# Patient Record
Sex: Female | Born: 1990 | Race: White | Hispanic: No | Marital: Married | Smoking: Never smoker
Health system: Southern US, Community
[De-identification: ages and names within clinical notes are randomized; demographics above are authoritative.]

## PROBLEM LIST (undated history)

## (undated) DIAGNOSIS — K219 Gastro-esophageal reflux disease without esophagitis: Secondary | ICD-10-CM

## (undated) DIAGNOSIS — R87619 Unspecified abnormal cytological findings in specimens from cervix uteri: Secondary | ICD-10-CM

## (undated) DIAGNOSIS — A4902 Methicillin resistant Staphylococcus aureus infection, unspecified site: Secondary | ICD-10-CM

## (undated) DIAGNOSIS — E559 Vitamin D deficiency, unspecified: Secondary | ICD-10-CM

## (undated) DIAGNOSIS — S060X9A Concussion with loss of consciousness of unspecified duration, initial encounter: Secondary | ICD-10-CM

## (undated) DIAGNOSIS — R51 Headache: Secondary | ICD-10-CM

## (undated) DIAGNOSIS — R61 Generalized hyperhidrosis: Secondary | ICD-10-CM

## (undated) DIAGNOSIS — S99911A Unspecified injury of right ankle, initial encounter: Secondary | ICD-10-CM

## (undated) DIAGNOSIS — K589 Irritable bowel syndrome without diarrhea: Secondary | ICD-10-CM

## (undated) DIAGNOSIS — J45909 Unspecified asthma, uncomplicated: Secondary | ICD-10-CM

## (undated) HISTORY — PX: WISDOM TOOTH EXTRACTION: SHX21

## (undated) HISTORY — DX: Unspecified injury of right ankle, initial encounter: S99.911A

## (undated) HISTORY — DX: Vitamin D deficiency, unspecified: E55.9

## (undated) HISTORY — DX: Methicillin resistant Staphylococcus aureus infection, unspecified site: A49.02

## (undated) HISTORY — DX: Concussion with loss of consciousness of unspecified duration, initial encounter: S06.0X9A

## (undated) HISTORY — DX: Unspecified abnormal cytological findings in specimens from cervix uteri: R87.619

## (undated) HISTORY — PX: OTHER SURGICAL HISTORY: SHX169

## (undated) HISTORY — DX: Generalized hyperhidrosis: R61

## (undated) HISTORY — DX: Irritable bowel syndrome, unspecified: K58.9

---
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
## 2009-01-14 HISTORY — PX: COLPOSCOPY: SHX161

## 2009-03-14 DIAGNOSIS — R87619 Unspecified abnormal cytological findings in specimens from cervix uteri: Secondary | ICD-10-CM

## 2009-03-14 HISTORY — DX: Unspecified abnormal cytological findings in specimens from cervix uteri: R87.619

## 2012-12-24 ENCOUNTER — Encounter (HOSPITAL_COMMUNITY): Payer: Self-pay

## 2012-12-25 ENCOUNTER — Encounter (HOSPITAL_COMMUNITY): Payer: Self-pay

## 2012-12-25 ENCOUNTER — Other Ambulatory Visit (HOSPITAL_COMMUNITY): Payer: Self-pay | Admitting: Otolaryngology

## 2012-12-25 ENCOUNTER — Encounter (HOSPITAL_COMMUNITY)
Admission: RE | Admit: 2012-12-25 | Discharge: 2012-12-25 | Disposition: A | Discharge status: Home / Self Care | Payer: BC Managed Care – PPO | Source: Ambulatory Visit | Attending: Otolaryngology | Admitting: Otolaryngology

## 2012-12-25 DIAGNOSIS — Z01812 Encounter for preprocedural laboratory examination: Secondary | ICD-10-CM | POA: Insufficient documentation

## 2012-12-25 DIAGNOSIS — Z01818 Encounter for other preprocedural examination: Secondary | ICD-10-CM | POA: Insufficient documentation

## 2012-12-25 LAB — CBC
HCT: 37.9 % (ref 36.0–46.0)
Hemoglobin: 12.8 g/dL (ref 12.0–15.0)
MCH: 26.2 pg (ref 26.0–34.0)
RBC: 4.89 MIL/uL (ref 3.87–5.11)

## 2012-12-25 LAB — HCG, SERUM, QUALITATIVE: Preg, Serum: NEGATIVE

## 2012-12-25 NOTE — H&P (Signed)
Jenna Lane, Jenna Lane 22 y.o., female [id number]     Chief Complaint: Recurrent tonsillolith formation  HPI: 22 year old white female has had off and on production of smelly tonsils those for several years.  They seem to be recurring more frequently here recently.  She has a foreign body sensation.  She reports halitosis.  No sore throat, strep throat, or tonsillitis.  She does not snore or have sleep apnea.  She is studying to become a Psychiatrist, and needs to try to work any possible surgery in around her holiday schedule.  One year return visit.  She continues to have recurrent tonsillolith debris pain, foreign body sensation, and halitosis.  She would like to proceed with tonsillectomy.  I think this is wholly reasonable.   I discussed the surgery in detail including risks and complications.  Questions were answered and informed consent was obtained.  Postoperative prescriptions for liquid hydrocodone and liquid oxycodone narcotic analgesics written and given.  I discussed advancement of diet and activity including return to school and return to work.  I will see her back one-week after surgery.  PMH:No past medical history on file.  Surg Hx: Past Surgical History  Procedure Laterality Date  . Wisdom tooth extraction      FHx:  No family history on file. SocHx:  reports that she has never smoked. She does not have any smokeless tobacco history on file. She reports that she drinks alcohol. She reports that she does not use illicit drugs.  ALLERGIES: No Known Allergies   (Not in a hospital admission)  Results for orders placed during the hospital encounter of 12/25/12 (from the past 48 hour(s))  CBC     Status: Abnormal   Collection Time    12/25/12 12:28 PM      Result Value Range   WBC 6.9  4.0 - 10.5 K/uL   RBC 4.89  3.87 - 5.11 MIL/uL   Hemoglobin 12.8  12.0 - 15.0 g/dL   HCT 53.6  64.4 - 03.4 %   MCV 77.5 (*) 78.0 - 100.0 fL   MCH 26.2  26.0 - 34.0 pg   MCHC 33.8  30.0  - 36.0 g/dL   RDW 74.2  59.5 - 63.8 %   Platelets 203  150 - 400 K/uL  HCG, SERUM, QUALITATIVE     Status: None   Collection Time    12/25/12 12:28 PM      Result Value Range   Preg, Serum NEGATIVE  NEGATIVE   Comment:            THE SENSITIVITY OF THIS     METHODOLOGY IS >10 mIU/mL.   No results found.  Last menstrual period 12/14/2012.  BP:108/76,  HR: 70 b/min,  Height: 5 ft 3 in, Weight: 154 lb , BMI: 27.3 kg/m2,   PHYSICAL EXAM: She is trim and healthy-appearing.  I do not detect bad breath.  Mental status is sharp.  She hears well in conversational speech.  Voice is clear and respirations unlabored through the nose.  The head is atraumatic and neck supple.  Cranial nerves intact.  Ear canals are clear with normal drums.  Anterior nose is moist and patent.  Oral cavity is moist with teeth in good repair.  Oropharynx shows 3+ cryptic tonsils with a normal soft palate.  No velopharyngeal insufficiency.  Neck without adenopathy.   Lungs: Clear to auscultation Heart: Regular rate and rhythm without murmurs Abdomen: Soft, active Extremities: Normal configuration Neurologic: Symmetric, grossly intact.  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  We are all set to take out your tonsils, possible adenoids, next week.  I emphasize hydration and pain control.  No strenuous activity for 2 weeks.  Recheck here 9 days.   See our tonsillectomy instructions  OxyCODONE HCl - 5 MG/5ML Oral Solution;5-10 ml po q4h prn severe pain; Qty400; R0; Rx. Hydrocodone-Acetaminophen 7.5-325 MG/15ML Oral Solution;10-20 ml po q4-6h prn pain; Qty500; R0; Rx.  Jenna Lane, Jenna Lane 12/25/2012, 7:02 PM

## 2013-01-01 ENCOUNTER — Encounter (HOSPITAL_COMMUNITY): Payer: Self-pay | Admitting: *Deleted

## 2013-01-01 MED ORDER — DEXAMETHASONE SODIUM PHOSPHATE 10 MG/ML IJ SOLN
8.0000 mg | Freq: Once | INTRAMUSCULAR | Status: AC
  Administered 2013-01-02: 8 mg via INTRAVENOUS
  Filled 2013-01-01: qty 1

## 2013-01-01 MED ORDER — SODIUM CHLORIDE 0.9 % IV SOLN
2.0000 g | Freq: Once | INTRAVENOUS | Status: AC
  Administered 2013-01-02: 2 g via INTRAVENOUS
  Filled 2013-01-01: qty 2000

## 2013-01-02 ENCOUNTER — Ambulatory Visit (HOSPITAL_COMMUNITY): Payer: BC Managed Care – PPO | Admitting: Anesthesiology

## 2013-01-02 ENCOUNTER — Ambulatory Visit (HOSPITAL_COMMUNITY)
Admission: RE | Admit: 2013-01-02 | Discharge: 2013-01-02 | Disposition: A | Discharge status: Home / Self Care | Payer: BC Managed Care – PPO | Source: Ambulatory Visit | Attending: Otolaryngology | Admitting: Otolaryngology

## 2013-01-02 ENCOUNTER — Encounter (HOSPITAL_COMMUNITY)
Admission: RE | Disposition: A | Discharge status: Home / Self Care | Payer: Self-pay | Source: Ambulatory Visit | Attending: Otolaryngology

## 2013-01-02 ENCOUNTER — Encounter (HOSPITAL_COMMUNITY): Payer: Self-pay | Admitting: *Deleted

## 2013-01-02 ENCOUNTER — Encounter (HOSPITAL_COMMUNITY): Payer: BC Managed Care – PPO | Admitting: Anesthesiology

## 2013-01-02 DIAGNOSIS — J358 Other chronic diseases of tonsils and adenoids: Secondary | ICD-10-CM | POA: Insufficient documentation

## 2013-01-02 HISTORY — DX: Headache: R51

## 2013-01-02 HISTORY — PX: TONSILLECTOMY AND ADENOIDECTOMY: SHX28

## 2013-01-02 SURGERY — TONSILLECTOMY AND ADENOIDECTOMY
Anesthesia: General | Site: Mouth

## 2013-01-02 MED ORDER — SUCCINYLCHOLINE CHLORIDE 20 MG/ML IJ SOLN
INTRAMUSCULAR | Status: DC | PRN
  Administered 2013-01-02: 100 mg via INTRAVENOUS

## 2013-01-02 MED ORDER — LACTATED RINGERS IV SOLN
INTRAVENOUS | Status: DC | PRN
  Administered 2013-01-02 (×2): via INTRAVENOUS

## 2013-01-02 MED ORDER — 0.9 % SODIUM CHLORIDE (POUR BTL) OPTIME
TOPICAL | Status: DC | PRN
  Administered 2013-01-02: 1000 mL

## 2013-01-02 MED ORDER — ONDANSETRON HCL 4 MG PO TABS: 4.0000 mg | ORAL_TABLET | ORAL | Status: DC | PRN

## 2013-01-02 MED ORDER — DIPHENHYDRAMINE HCL 50 MG/ML IJ SOLN
INTRAMUSCULAR | Status: DC | PRN
  Administered 2013-01-02: 12.5 mg via INTRAVENOUS

## 2013-01-02 MED ORDER — PROMETHAZINE HCL 25 MG/ML IJ SOLN: 6.2500 mg | INTRAMUSCULAR | Status: DC | PRN

## 2013-01-02 MED ORDER — HYDROCODONE-ACETAMINOPHEN 7.5-325 MG/15ML PO SOLN: 10.0000 mL | ORAL | Status: DC | PRN

## 2013-01-02 MED ORDER — OXYCODONE-ACETAMINOPHEN 5-325 MG/5ML PO SOLN: 5.0000 mL | ORAL | Status: DC | PRN

## 2013-01-02 MED ORDER — PROPOFOL 10 MG/ML IV BOLUS
INTRAVENOUS | Status: DC | PRN
  Administered 2013-01-02: 200 mg via INTRAVENOUS

## 2013-01-02 MED ORDER — ONDANSETRON HCL 4 MG/2ML IJ SOLN
INTRAMUSCULAR | Status: DC | PRN
  Administered 2013-01-02: 4 mg via INTRAVENOUS

## 2013-01-02 MED ORDER — MIDAZOLAM HCL 5 MG/5ML IJ SOLN
INTRAMUSCULAR | Status: DC | PRN
  Administered 2013-01-02: 2 mg via INTRAVENOUS

## 2013-01-02 MED ORDER — LIDOCAINE-EPINEPHRINE 0.5 %-1:200000 IJ SOLN
INTRAMUSCULAR | Status: DC | PRN
  Administered 2013-01-02: 10 mL

## 2013-01-02 MED ORDER — OXYCODONE HCL 5 MG PO TABS: 5.0000 mg | ORAL_TABLET | Freq: Once | ORAL | Status: AC | PRN

## 2013-01-02 MED ORDER — OXYCODONE HCL 5 MG/5ML PO SOLN
ORAL | Status: AC
  Filled 2013-01-02: qty 5

## 2013-01-02 MED ORDER — MORPHINE SULFATE 2 MG/ML IJ SOLN: 1.0000 mg | INTRAMUSCULAR | Status: DC | PRN

## 2013-01-02 MED ORDER — DEXTROSE-NACL 5-0.45 % IV SOLN: INTRAVENOUS | Status: DC

## 2013-01-02 MED ORDER — LACTATED RINGERS IV SOLN
INTRAVENOUS | Status: DC
  Administered 2013-01-02: 07:00:00 via INTRAVENOUS

## 2013-01-02 MED ORDER — OXYCODONE HCL 5 MG/5ML PO SOLN
5.0000 mg | Freq: Once | ORAL | Status: AC | PRN
  Administered 2013-01-02: 5 mg via ORAL

## 2013-01-02 MED ORDER — HYDROMORPHONE HCL PF 1 MG/ML IJ SOLN: 0.2500 mg | INTRAMUSCULAR | Status: DC | PRN

## 2013-01-02 MED ORDER — FENTANYL CITRATE 0.05 MG/ML IJ SOLN
INTRAMUSCULAR | Status: DC | PRN
  Administered 2013-01-02: 25 ug via INTRAVENOUS
  Administered 2013-01-02 (×2): 50 ug via INTRAVENOUS
  Administered 2013-01-02: 25 ug via INTRAVENOUS
  Administered 2013-01-02: 100 ug via INTRAVENOUS

## 2013-01-02 MED ORDER — ONDANSETRON HCL 4 MG/2ML IJ SOLN: 4.0000 mg | INTRAMUSCULAR | Status: DC | PRN

## 2013-01-02 MED ORDER — LIDOCAINE-EPINEPHRINE 0.5 %-1:200000 IJ SOLN
INTRAMUSCULAR | Status: AC
  Filled 2013-01-02: qty 1

## 2013-01-02 MED ORDER — LIDOCAINE HCL (CARDIAC) 20 MG/ML IV SOLN
INTRAVENOUS | Status: DC | PRN
  Administered 2013-01-02: 50 mg via INTRAVENOUS

## 2013-01-02 SURGICAL SUPPLY — 33 items
CANISTER SUCTION 2500CC (MISCELLANEOUS) ×2 IMPLANT
CATH ROBINSON RED A/P 10FR (CATHETERS) ×2 IMPLANT
CLEANER TIP ELECTROSURG 2X2 (MISCELLANEOUS) ×2 IMPLANT
CLOTH BEACON ORANGE TIMEOUT ST (SAFETY) IMPLANT
COAGULATOR SUCT SWTCH 10FR 6 (ELECTROSURGICAL) ×2 IMPLANT
CRADLE DONUT ADULT HEAD (MISCELLANEOUS) ×2 IMPLANT
DECANTER SPIKE VIAL GLASS SM (MISCELLANEOUS) ×2 IMPLANT
ELECT COATED BLADE 2.86 ST (ELECTRODE) IMPLANT
ELECT REM PT RETURN 9FT ADLT (ELECTROSURGICAL) ×2
ELECT REM PT RETURN 9FT PED (ELECTROSURGICAL)
ELECTRODE REM PT RETRN 9FT PED (ELECTROSURGICAL) IMPLANT
ELECTRODE REM PT RTRN 9FT ADLT (ELECTROSURGICAL) ×1 IMPLANT
GAUZE SPONGE 4X4 16PLY XRAY LF (GAUZE/BANDAGES/DRESSINGS) ×2 IMPLANT
GLOVE ECLIPSE 8.0 STRL XLNG CF (GLOVE) ×2 IMPLANT
GOWN PREVENTION PLUS XLARGE (GOWN DISPOSABLE) ×2 IMPLANT
GOWN STRL NON-REIN LRG LVL3 (GOWN DISPOSABLE) ×2 IMPLANT
KIT BASIN OR (CUSTOM PROCEDURE TRAY) ×2 IMPLANT
KIT ROOM TURNOVER OR (KITS) ×2 IMPLANT
NEEDLE SPNL 22GX3.5 QUINCKE BK (NEEDLE) ×2 IMPLANT
NS IRRIG 1000ML POUR BTL (IV SOLUTION) ×2 IMPLANT
PACK SURGICAL SETUP 50X90 (CUSTOM PROCEDURE TRAY) ×2 IMPLANT
PAD ARMBOARD 7.5X6 YLW CONV (MISCELLANEOUS) ×4 IMPLANT
PENCIL FOOT CONTROL (ELECTRODE) ×2 IMPLANT
SPECIMEN JAR SMALL (MISCELLANEOUS) IMPLANT
SPONGE TONSIL 1 RF SGL (DISPOSABLE) ×2 IMPLANT
SYR BULB 3OZ (MISCELLANEOUS) IMPLANT
SYR CONTROL 10ML LL (SYRINGE) ×2 IMPLANT
TOWEL OR 17X24 6PK STRL BLUE (TOWEL DISPOSABLE) ×4 IMPLANT
TUBE CONNECTING 12X1/4 (SUCTIONS) ×2 IMPLANT
TUBE SALEM SUMP 14F W/ARV (TUBING) IMPLANT
TUBE SALEM SUMP 16 FR W/ARV (TUBING) IMPLANT
WATER STERILE IRR 1000ML POUR (IV SOLUTION) ×2 IMPLANT
YANKAUER SUCT BULB TIP NO VENT (SUCTIONS) ×2 IMPLANT

## 2013-01-02 NOTE — Op Note (Signed)
01/02/2013  8:10 AM    Bobbye Riggs  [id number]   Pre-Op Dx:  Chronic tonsillolith formation  Post-op Dx: Same  Proc: Tonsillectomy   Surg:  Flo Shanks T MD  Anes:  GOT  EBL:  Minimal  Comp:  None  Findings:  3+ pedunculated tonsils. Normal soft palate. No residual adenoids. Clear anterior nose.  Procedure:  With the patient in a comfortable supine position,  general orotracheal anesthesia was induced without difficulty.  At an appropriate level, the patient was turned 90 away from anesthesia and placed in Trendelenburg.  A clean preparation and draping was accomplished.  Taking care to protect lips, teeth, and endotracheal tube, the Crowe-Davis mouth gag was introduced, expanded for visualization, and suspended from the Mayo stand in the standard fashion.  The findings were as described above.  Palate  retractor  and mirror were used to examine the nasopharynx with the findings as described above.   Anterior nose was examined with a nasal speculum with the findings as described above.  No adenoidectomy was indicated.  1/4% Xylocaine with 1:200,000 epinephrine, 10 cc's, was infiltrated into the peritonsillar planes on both sides for intraoperative hemostasis.  Several minutes were allowed for this to take effect.   Beginning on the  right side, the tonsil was grasped and retracted medially.  The mucosa over the anterior and superior poles was coagulated and then cut down to the capsule of the tonsil.  Using the cautery tip as a blunt dissector, the tonsil was dissected from its muscular fossa from anterior to posterior and from superior to inferior.  Fibrous bands were lysed as necessary.  Crossing vessels were coagulated as identified.  The tonsil was removed in its entirety as determined by examination of both tonsil and fossa.  A small additional quantity of cautery rendered the fossa hemostatic.    After completing the 1st tonsillectomy, the 2nd one was performed in  identical fashion.   At this point the  mouthgag was relaxed for several minutes.  Upon reexpansion,  Hemostasis was observed. The mouth gag  was relaxed and removed.  The dental status was intact.   At this point the procedure was completed.  The patient was returned to anesthesia, awakened, extubated, and transferred to recovery in stable condition.   Dispo:  OR to PACU.   Will observe for six hours, overnight if necessary and then discharge to home in care of family.  Plan:  Analgesia, hydration, limited activity for two weeks.  Advance diet as comfortable.  Return to school or work at 10 days.  Cephus Richer  MD.

## 2013-01-02 NOTE — H&P (View-Only) (Signed)
[name],  Jenna Lane 22 y.o., female [id number]     Chief Complaint: Recurrent tonsillolith formation  HPI: 21-year-old white female has had off and on production of smelly tonsils those for several years.  They seem to be recurring more frequently here recently.  She has a foreign body sensation.  She reports halitosis.  No sore throat, strep throat, or tonsillitis.  She does not snore or have sleep apnea.  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  One year return visit.  She continues to have recurrent tonsillolith debris pain, foreign body sensation, and halitosis.  She would like to proceed with tonsillectomy.  I think this is wholly reasonable.   I discussed the surgery in detail including risks and complications.  Questions were answered and informed consent was obtained.  Postoperative prescriptions for liquid hydrocodone and liquid oxycodone narcotic analgesics written and given.  I discussed advancement of diet and activity including return to school and return to work.  I will see her back one-week after surgery.  PMH:No past medical history on file.  Surg Hx: Past Surgical History  Procedure Laterality Date  . Wisdom tooth extraction      FHx:  No family history on file. SocHx:  reports that she has never smoked. She does not have any smokeless tobacco history on file. She reports that she drinks alcohol. She reports that she does not use illicit drugs.  ALLERGIES: No Known Allergies   (Not in a hospital admission)  Results for orders placed during the hospital encounter of 12/25/12 (from the past 48 hour(s))  CBC     Status: Abnormal   Collection Time    12/25/12 12:28 PM      Result Value Range   WBC 6.9  4.0 - 10.5 K/uL   RBC 4.89  3.87 - 5.11 MIL/uL   Hemoglobin 12.8  12.0 - 15.0 g/dL   HCT 37.9  36.0 - 46.0 %   MCV 77.5 (*) 78.0 - 100.0 fL   MCH 26.2  26.0 - 34.0 pg   MCHC 33.8  30.0  - 36.0 g/dL   RDW 14.1  11.5 - 15.5 %   Platelets 203  150 - 400 K/uL  HCG, SERUM, QUALITATIVE     Status: None   Collection Time    12/25/12 12:28 PM      Result Value Range   Preg, Serum NEGATIVE  NEGATIVE   Comment:            THE SENSITIVITY OF THIS     METHODOLOGY IS >10 mIU/mL.   No results found.  Last menstrual period 12/14/2012.  BP:108/76,  HR: 70 b/min,  Height: 5 ft 3 in, Weight: 154 lb , BMI: 27.3 kg/m2,   PHYSICAL EXAM: She is trim and healthy-appearing.  I do not detect bad breath.  Mental status is sharp.  She hears well in conversational speech.  Voice is clear and respirations unlabored through the nose.  The head is atraumatic and neck supple.  Cranial nerves intact.  Ear canals are clear with normal drums.  Anterior nose is moist and patent.  Oral cavity is moist with teeth in good repair.  Oropharynx shows 3+ cryptic tonsils with a normal soft palate.  No velopharyngeal insufficiency.  Neck without adenopathy.   Lungs: Clear to auscultation Heart: Regular rate and rhythm without murmurs Abdomen: Soft, active Extremities: Normal configuration Neurologic: Symmetric, grossly intact.     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  We are all set to take out your tonsils, possible adenoids, next week.  I emphasize hydration and pain control.  No strenuous activity for 2 weeks.  Recheck here 9 days.   See our tonsillectomy instructions  OxyCODONE HCl - 5 MG/5ML Oral Solution;5-10 ml po q4h prn severe pain; Qty400; R0; Rx. Hydrocodone-Acetaminophen 7.5-325 MG/15ML Oral Solution;10-20 ml po q4-6h prn pain; Qty500; R0; Rx.  Keina Mutch 12/25/2012, 7:02 PM     

## 2013-01-02 NOTE — Anesthesia Preprocedure Evaluation (Addendum)
Anesthesia Evaluation  Patient identified by MRN, date of birth, ID band Patient awake    Reviewed: Allergy & Precautions, H&P , NPO status , Patient's Chart, lab work & pertinent test results, reviewed documented beta blocker date and time   Airway Mallampati: II TM Distance: >3 FB Neck ROM: full    Dental  (+) Teeth Intact and Dental Advisory Given   Pulmonary neg pulmonary ROS,  breath sounds clear to auscultation        Cardiovascular negative cardio ROS  Rhythm:regular Rate:Normal     Neuro/Psych negative neurological ROS  negative psych ROS   GI/Hepatic negative GI ROS, Neg liver ROS,   Endo/Other  negative endocrine ROS  Renal/GU negative Renal ROS     Musculoskeletal   Abdominal   Peds  Hematology   Anesthesia Other Findings   Reproductive/Obstetrics negative OB ROS                          Anesthesia Physical Anesthesia Plan  ASA: I  Anesthesia Plan: General ETT   Post-op Pain Management:    Induction:   Airway Management Planned:   Additional Equipment:   Intra-op Plan:   Post-operative Plan:   Informed Consent: I have reviewed the patients History and Physical, chart, labs and discussed the procedure including the risks, benefits and alternatives for the proposed anesthesia with the patient or authorized representative who has indicated his/her understanding and acceptance.   Dental Advisory Given and Dental advisory given  Plan Discussed with: CRNA, Surgeon and Anesthesiologist  Anesthesia Plan Comments:        Anesthesia Quick Evaluation

## 2013-01-02 NOTE — Transfer of Care (Signed)
Immediate Anesthesia Transfer of Care Note  Patient: Jenna Lane  Procedure(s) Performed: Procedure(s): TONSILLECTOMY  (N/A)  Patient Location: PACU  Anesthesia Type:General  Level of Consciousness: awake  Airway & Oxygen Therapy: Patient Spontanous Breathing  Post-op Assessment: Report given to PACU RN and Post -op Vital signs reviewed and stable  Post vital signs: Reviewed and stable  Complications: No apparent anesthesia complications

## 2013-01-02 NOTE — Interval H&P Note (Signed)
History and Physical Interval Note:  01/02/2013 8:01 AM  Jenna Lane  has presented today for surgery, with the diagnosis of recurrent tonsilitis  tonsillolith formation  The various methods of treatment have been discussed with the patient and family. After consideration of risks, benefits and other options for treatment, the patient has consented to  Procedure(s): TONSILLECTOMY AND POSSIBLE ADENOIDECTOMY (N/A) as a surgical intervention .  The patient's history has been re-reviewed, patient re-examined, no change in status, stable for surgery.  I have re-reviewed the patient's chart and labs.  Questions were answered to the patient's satisfaction.     Flo Shanks

## 2013-01-02 NOTE — Anesthesia Procedure Notes (Addendum)
Procedure Name: Intubation Date/Time: 01/02/2013 7:38 AM Performed by: Brien Mates D Pre-anesthesia Checklist: Patient identified, Emergency Drugs available, Suction available, Timeout performed and Patient being monitored Patient Re-evaluated:Patient Re-evaluated prior to inductionOxygen Delivery Method: Circle system utilized Preoxygenation: Pre-oxygenation with 100% oxygen Intubation Type: IV induction Ventilation: Mask ventilation without difficulty Laryngoscope Size: Miller and 2 Grade View: Grade I Tube type: Oral Tube size: 7.5 mm Number of attempts: 1 Airway Equipment and Method: Stylet Placement Confirmation: ETT inserted through vocal cords under direct vision,  positive ETCO2 and breath sounds checked- equal and bilateral Secured at: 21 cm Tube secured with: Tape (tagged) Dental Injury: Teeth and Oropharynx as per pre-operative assessment

## 2013-01-02 NOTE — Discharge Summary (Signed)
  01/02/2013 3:28 PM  Jenna Lane [id number]  Discharge summary    Temp:  [98.2 F (36.8 C)-98.4 F (36.9 C)] 98.4 F (36.9 C) (12/20 0915) Pulse Rate:  [68-108] 83 (12/20 0945) Resp:  [14-19] 19 (12/20 0945) BP: (115-131)/(60-80) 119/75 mmHg (12/20 0915) SpO2:  [93 %-98 %] 98 % (12/20 0945),     Intake/Output Summary (Last 24 hours) at 01/02/13 1528 Last data filed at 01/02/13 0826  Gross per 24 hour  Intake   1300 ml  Output     25 ml  Net   1275 ml    No results found for this or any previous visit (from the past 24 hour(s)).  Admit:  20 DEC Discharge: 20 DEC Final Diagnosis:  Chronic cryptic tonsillitis Proc:  Tonsillectomy 20 DEC Comp: none Cond: ambulatory, voiding. Pain controlled.  No bleeding.  Taking po intake Rx's:  Oxycodone, Hydrocodone liquid narcotic analgesics Recheck: 10 days Instructions written and given  Hosp course:  Underwent tonsillectomy.  Was transferred to PACU.    Was discharged home 4 hrs earlier than ordered due to [facility] medical records inconsistencies.     Flo Shanks

## 2013-01-02 NOTE — Anesthesia Postprocedure Evaluation (Signed)
Anesthesia Post Note  Patient: Jenna Lane  Procedure(s) Performed: Procedure(s) (LRB): TONSILLECTOMY  (N/A)  Anesthesia type: general  Patient location: PACU  Post pain: Pain level controlled  Post assessment: Patient's Cardiovascular Status Stable  Last Vitals:  Filed Vitals:   01/02/13 0830  BP: 121/72  Pulse: 96  Temp:   Resp: 16    Post vital signs: Reviewed and stable  Level of consciousness: sedated  Complications: No apparent anesthesia complications

## 2013-01-05 ENCOUNTER — Encounter (HOSPITAL_COMMUNITY): Payer: Self-pay | Admitting: Otolaryngology

## 2013-03-01 ENCOUNTER — Encounter: Payer: Self-pay | Admitting: Obstetrics & Gynecology

## 2013-03-02 ENCOUNTER — Ambulatory Visit: Payer: Self-pay | Admitting: Obstetrics & Gynecology

## 2013-04-05 ENCOUNTER — Ambulatory Visit (INDEPENDENT_AMBULATORY_CARE_PROVIDER_SITE_OTHER): Payer: BC Managed Care – PPO | Admitting: Obstetrics & Gynecology

## 2013-04-05 ENCOUNTER — Encounter: Payer: Self-pay | Admitting: Obstetrics & Gynecology

## 2013-04-05 VITALS — BP 98/60 | HR 78 | Resp 18 | Ht 63.0 in | Wt 160.0 lb

## 2013-04-05 DIAGNOSIS — Z0000 Encounter for general adult medical examination without abnormal findings: Secondary | ICD-10-CM

## 2013-04-05 DIAGNOSIS — Z202 Contact with and (suspected) exposure to infections with a predominantly sexual mode of transmission: Secondary | ICD-10-CM

## 2013-04-05 DIAGNOSIS — Z124 Encounter for screening for malignant neoplasm of cervix: Secondary | ICD-10-CM

## 2013-04-05 DIAGNOSIS — Z01419 Encounter for gynecological examination (general) (routine) without abnormal findings: Secondary | ICD-10-CM

## 2013-04-05 LAB — POCT URINALYSIS DIPSTICK
BILIRUBIN UA: NEGATIVE
Glucose, UA: NEGATIVE
KETONES UA: NEGATIVE
NITRITE UA: NEGATIVE
PH UA: 5
Protein, UA: NEGATIVE
RBC UA: NEGATIVE
Urobilinogen, UA: NEGATIVE

## 2013-04-05 MED ORDER — DROSPIRENONE-ETHINYL ESTRADIOL 3-0.02 MG PO TABS
1.0000 | ORAL_TABLET | Freq: Every day | ORAL | Status: DC
Start: 2013-04-05 — End: 2014-03-18

## 2013-04-05 NOTE — Progress Notes (Signed)
23 y.o. G0P0000 SingleCaucasianF here for annual exam.  Cycles are regular.  Using condoms.  Considering restarting OCPs.  Not sure but wants an Rx.  Aware of risks--DVT/PE, elevated BP, nausea, headache  H/O AGUS pap with negative evaluation and negative follow-up paps.  Desires a Pap today.  D/w pt guidelines.    Desires STD testing today.  With current for   Has lost almost 20 pounds over last two years.  Goal is 140-145.  Patient's last menstrual period was 03/12/2013.          Sexually active: yes  The current method of family planning is condoms per patient 100%of the time.    Exercising: no  not regularly Smoker:  no  Health Maintenance: Pap:  02/24/12 WNL History of abnormal Pap:  Yes 3/11 AGUS pap-with colpo/ECC-CIN I MMG:  none Colonoscopy:  none BMD:   none TDaP:  2009 Screening Labs: not today, Hb today: 13.0, Urine today: WBC-+1, PH-5.0   reports that she has never smoked. She has never used smokeless tobacco. She reports that she drinks alcohol. She reports that she does not use illicit drugs.  Past Medical History  Diagnosis Date  . Headache(784.0)     quite a few years ago  . MRSA infection 2009    on skin  . Abnormal Pap smear of cervix 3/11    h/o AGUS pap  . 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 Right ankle injury     Past Surgical History  Procedure Laterality Date  . Wisdom tooth extraction    . Tonsillectomy and adenoidectomy N/A 01/02/2013    Procedure: TONSILLECTOMY ;  Surgeon: Jodi Marble, MD;  Location: [facility];  Service: ENT;  Laterality: N/A;  . Cystectomy      left axillary    No current outpatient prescriptions on file.   No current facility-administered medications for this visit.    Family History  Problem Relation Age of Onset  . Hyperlipidemia Father   . Hypertension Father     ?  Marland Kitchen Heart disease Paternal Grandfather   . Depression Maternal Uncle     ROS:  Pertinent items are noted in HPI.  Otherwise, a comprehensive ROS was  negative.  Exam:   BP 98/60  Pulse 78  Resp 18  Ht 5\' 3"  (1.6 m)  Wt 160 lb (72.576 kg)  BMI 28.35 kg/m2  LMP 03/12/2013  Weight change: -9lbs  Height: 5\' 3"  (160 cm)  Ht Readings from Last 3 Encounters:  04/05/13 5\' 3"  (1.6 m)  12/25/12 5\' 3"  (1.6 m)    General appearance: alert, cooperative and appears stated age Head: Normocephalic, without obvious abnormality, atraumatic Neck: no adenopathy, supple, symmetrical, trachea midline and thyroid normal to inspection and palpation Lungs: clear to auscultation bilaterally Breasts: normal appearance, no masses or tenderness Heart: regular rate and rhythm Abdomen: soft, non-tender; bowel sounds normal; no masses,  no organomegaly Extremities: extremities normal, atraumatic, no cyanosis or edema Skin: Skin color, texture, turgor normal. No rashes or lesions Lymph nodes: Cervical, supraclavicular, and axillary nodes normal. No abnormal inguinal nodes palpated Neurologic: Grossly normal   Pelvic: External genitalia:  no lesions              Urethra:  normal appearing urethra with no masses, tenderness or lesions              Bartholins and Skenes: normal                 Vagina: normal appearing vagina  with normal color and discharge, no lesions              Cervix: no lesions              Pap taken: yes Bimanual Exam:  Uterus:  normal size, contour, position, consistency, mobility, non-tender              Adnexa: normal adnexa and no mass, fullness, tenderness               Rectovaginal: Confirms               Anus:  normal sphincter tone, no lesions  A:  Well Woman with normal exam Family hx of heart disease in father, pat. Uncle, and PGF Desires STD testing.   H/O AGUS with colpo and biopsy 3/11  P:   mammogram pap smear only today Rx for gianvi given.  Pt will let me know if she starts it. Gc/Chl, HIV, and RPR today.  H/o Hep B vaccination as a child.   Lipids and CMP return annually or prn  An After Visit Summary was  printed and given to the patient.

## 2013-04-05 NOTE — Patient Instructions (Signed)

## 2013-04-06 LAB — LIPID PANEL
Cholesterol: 142 mg/dL (ref 0–200)
HDL: 68 mg/dL (ref >39)
LDL Cholesterol: 61 mg/dL (ref 0–99)
TRIGLYCERIDES: 65 mg/dL (ref <150)
Total CHOL/HDL Ratio: 2.1 Ratio
VLDL: 13 mg/dL (ref 0–40)

## 2013-04-06 LAB — COMPREHENSIVE METABOLIC PANEL
ALT: 12 U/L (ref 0–35)
AST: 17 U/L (ref 0–37)
Albumin: 4.6 g/dL (ref 3.5–5.2)
Alkaline Phosphatase: 54 U/L (ref 39–117)
BILIRUBIN TOTAL: 0.6 mg/dL (ref 0.2–1.2)
BUN: 13 mg/dL (ref 6–23)
CO2: 27 meq/L (ref 19–32)
CREATININE: 0.57 mg/dL (ref 0.50–1.10)
Calcium: 9.5 mg/dL (ref 8.4–10.5)
Chloride: 103 mEq/L (ref 96–112)
GLUCOSE: 75 mg/dL (ref 70–99)
Potassium: 4.6 mEq/L (ref 3.5–5.3)
Sodium: 137 mEq/L (ref 135–145)
Total Protein: 6.7 g/dL (ref 6.0–8.3)

## 2013-04-06 LAB — RPR

## 2013-04-06 LAB — HEMOGLOBIN, FINGERSTICK: HEMOGLOBIN, FINGERSTICK: 13 g/dL (ref 12.0–16.0)

## 2013-04-06 LAB — HIV ANTIBODY (ROUTINE TESTING W REFLEX): HIV: NONREACTIVE

## 2013-04-08 LAB — IPS N GONORRHOEA AND CHLAMYDIA BY PCR

## 2013-04-08 LAB — IPS PAP SMEAR ONLY

## 2013-04-09 ENCOUNTER — Telehealth: Payer: Self-pay

## 2013-04-09 NOTE — Telephone Encounter (Signed)
Patients is calling kelly back

## 2013-04-09 NOTE — Telephone Encounter (Signed)
Message copied by Robley Fries on Fri Apr 09, 2013  3:24 PM ------      Message from: Isma Salon      Created: Tue Apr 06, 2013  1:40 PM       Inform HIV and RPR neg.  Lipids great.  CMP normal.  GC/CHL pending. ------

## 2013-04-09 NOTE — Telephone Encounter (Signed)
Lmtcb//kn 

## 2013-04-12 NOTE — Telephone Encounter (Signed)
Patient returning Kelly's call. °

## 2013-04-12 NOTE — Telephone Encounter (Signed)
Patient is calling kelly back °

## 2013-04-13 NOTE — Telephone Encounter (Signed)
Lmtcb//kn 

## 2013-04-13 NOTE — Telephone Encounter (Signed)
Returning a call to Kelly °

## 2013-04-14 NOTE — Telephone Encounter (Signed)
Patient is calling kelly back °

## 2013-04-26 NOTE — Telephone Encounter (Signed)
Lmtcb//kn 

## 2013-04-27 NOTE — Telephone Encounter (Signed)
Patient notified of all results. 

## 2013-09-28 ENCOUNTER — Encounter (HOSPITAL_COMMUNITY): Payer: Self-pay | Admitting: Emergency Medicine

## 2013-09-28 ENCOUNTER — Emergency Department (INDEPENDENT_AMBULATORY_CARE_PROVIDER_SITE_OTHER)
Admission: EM | Admit: 2013-09-28 | Discharge: 2013-09-28 | Disposition: A | Discharge status: Home / Self Care | Payer: BC Managed Care – PPO | Source: Home / Self Care | Attending: Family Medicine | Admitting: Family Medicine

## 2013-09-28 DIAGNOSIS — H1012 Acute atopic conjunctivitis, left eye: Secondary | ICD-10-CM

## 2013-09-28 DIAGNOSIS — J309 Allergic rhinitis, unspecified: Secondary | ICD-10-CM

## 2013-09-28 DIAGNOSIS — H1010 Acute atopic conjunctivitis, unspecified eye: Secondary | ICD-10-CM

## 2013-09-28 MED ORDER — CETIRIZINE HCL 10 MG PO TABS: 10.0000 mg | ORAL_TABLET | Freq: Every day | ORAL | Status: DC

## 2013-09-28 MED ORDER — OLOPATADINE HCL 0.2 % OP SOLN: 1.0000 [drp] | Freq: Two times a day (BID) | OPHTHALMIC | Status: DC

## 2013-09-28 NOTE — ED Provider Notes (Signed)
CSN: [id number]     Arrival date & time 09/28/13  1041 History   First MD Initiated Contact with Patient 09/28/13 1047     Chief Complaint  Patient presents with  . Eye Problem   (Consider location/radiation/quality/duration/timing/severity/associated sxs/prior Treatment) Patient is a 23 y.o. female presenting with conjunctivitis. The history is provided by the patient.  Conjunctivitis This is a new problem. The current episode started 2 days ago. The problem has been gradually worsening. Pertinent negatives include no chest pain and no abdominal pain.    Past Medical History  Diagnosis Date  . Headache(784.0)     quite a few years ago  . MRSA infection 2009    on skin  . Abnormal Pap smear of cervix 3/11    h/o AGUS pap  . 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 Right ankle injury    Past Surgical History  Procedure Laterality Date  . Wisdom tooth extraction    . Tonsillectomy and adenoidectomy N/A 01/02/2013    Procedure: TONSILLECTOMY ;  Surgeon: Jodi Marble, MD;  Location: [facility];  Service: ENT;  Laterality: N/A;  . Skin cyst excision      left axillary   Family History  Problem Relation Age of Onset  . Hyperlipidemia Father   . Hypertension Father     ?  Marland Kitchen Heart disease Paternal Grandfather   . Depression Maternal Uncle    History  Substance Use Topics  . Smoking status: Never Smoker   . Smokeless tobacco: Never Used  . Alcohol Use: Yes     Comment: 1 x week   OB History   Grav Para Term Preterm Abortions TAB SAB Ect Mult Living   0 0 0 0 0 0 0 0 0 0      Review of Systems  Constitutional: Negative.   HENT: Positive for congestion, postnasal drip and rhinorrhea.   Eyes: Positive for redness and itching. Negative for photophobia, pain and visual disturbance.  Respiratory: Negative.   Cardiovascular: Negative.  Negative for chest pain.  Gastrointestinal: Negative for abdominal pain.    Allergies  Review of patient's allergies indicates no known allergies.  Home  Medications   Prior to Admission medications   Medication Sig Start Date End Date Taking? Authorizing Provider  cetirizine (ZYRTEC) 10 MG tablet Take 1 tablet (10 mg total) by mouth daily. One tab daily for allergies 09/28/13   Billy Fischer, MD  drospirenone-ethinyl estradiol Sherrill Raring) 3-0.02 MG tablet Take 1 tablet by mouth daily. 04/05/13   Lyman Speller, MD  Olopatadine HCl 0.2 % SOLN Apply 1 drop to eye 2 (two) times daily. 09/28/13   Billy Fischer, MD   BP 118/80  Pulse 83  Temp(Src) 98.3 F (36.8 C) (Oral)  Resp 16  SpO2 97% Physical Exam  Nursing note and vitals reviewed. Constitutional: She is oriented to person, place, and time. She appears well-developed and well-nourished.  HENT:  Right Ear: External ear normal.  Left Ear: External ear normal.  Mouth/Throat: Oropharynx is clear and moist.  Eyes: EOM and lids are normal. Pupils are equal, round, and reactive to light. Right eye exhibits no discharge. Left eye exhibits no discharge. Right conjunctiva is not injected. Right conjunctiva has no hemorrhage. Left conjunctiva is injected. Left conjunctiva has no hemorrhage.  Neck: Normal range of motion. Neck supple.  Pulmonary/Chest: Breath sounds normal.  Lymphadenopathy:    She has no cervical adenopathy.  Neurological: She is alert and oriented to person, place, and time.  Skin: Skin  is warm and dry.    ED Course  Procedures (including critical care time) Labs Review Labs Reviewed - No data to display  Imaging Review No results found.   MDM   1. Allergic conjunctivitis and rhinitis, left        Billy Fischer, MD 09/28/13 1103

## 2014-03-18 ENCOUNTER — Ambulatory Visit (INDEPENDENT_AMBULATORY_CARE_PROVIDER_SITE_OTHER): Payer: 59 | Admitting: Certified Nurse Midwife

## 2014-03-18 ENCOUNTER — Encounter: Payer: Self-pay | Admitting: Certified Nurse Midwife

## 2014-03-18 VITALS — BP 100/68 | HR 70 | Resp 16 | Ht 63.0 in | Wt 174.0 lb

## 2014-03-18 DIAGNOSIS — Z30011 Encounter for initial prescription of contraceptive pills: Secondary | ICD-10-CM

## 2014-03-18 DIAGNOSIS — N760 Acute vaginitis: Secondary | ICD-10-CM

## 2014-03-18 DIAGNOSIS — A499 Bacterial infection, unspecified: Secondary | ICD-10-CM

## 2014-03-18 DIAGNOSIS — B9689 Other specified bacterial agents as the cause of diseases classified elsewhere: Secondary | ICD-10-CM

## 2014-03-18 DIAGNOSIS — Z113 Encounter for screening for infections with a predominantly sexual mode of transmission: Secondary | ICD-10-CM

## 2014-03-18 LAB — HEPATITIS C ANTIBODY: HCV Ab: NEGATIVE

## 2014-03-18 MED ORDER — METRONIDAZOLE 0.75 % VA GEL: 1.0000 | Freq: Two times a day (BID) | VAGINAL | Status: DC

## 2014-03-18 MED ORDER — NORETHIN ACE-ETH ESTRAD-FE 1-20 MG-MCG(24) PO TABS: 1.0000 | ORAL_TABLET | Freq: Every day | ORAL | Status: DC

## 2014-03-18 NOTE — Progress Notes (Signed)
24 y.o.white female g0p0 here with complaint of vaginal symptoms of odor only for the past 2 months. Describes discharge as white. Partner change, desires  STD screening.Periods normal, no issues. Contraception consistent condoms. Patient restarted OCP Yaz and had rash on neck within 2 hours and tried again and occurred. Stopped OCP and no more problems. Desires OCP restart with different OCP. Has taken Loestrin 24 Fe without issues.  Denies new personal products. Urinary symptoms none .   O:Healthy female WDWN Affect: normal, orientation x 3  Exam: Abdomen:soft, non tender Lymph node: no enlargement or tenderness Pelvic exam: External genital:  BUS: negative Vagina: grey watery odorous discharge noted. Ph:5.0   ,Wet prep taken Cervix: normal, non tender, negative CMT Uterus: normal, non tender Adnexa:normal, non tender, no masses or fullness noted   Wet Prep results:clue cells   A:Normal pelvic exam BV STD screening Allergic reaction to Blossom Contraception desired, aex due this month will schedule   P:Discussed findings of BV and etiology. Discussed Aveeno or baking soda sitz bath for comfort. Avoid moist clothes or pads for extended period of time. If working out in gym clothes or swim suits for long periods of time change underwear or bottoms of swimsuit if possible.  Rx: Metrogel with orders. Labs: HIV, RPR, STD panel, HSV 1,2, Hep.C Discussed concern with allergy and patient aware if any symptoms to stop OCP. Has Benadryl tablets available Rx Loestrin 24 Fe see order Schedule AEX  Rv prn,aex

## 2014-03-18 NOTE — Patient Instructions (Signed)

## 2014-03-19 LAB — STD PANEL
HIV 1&2 Ab, 4th Generation: NONREACTIVE
Hepatitis B Surface Ag: NEGATIVE

## 2014-03-21 LAB — HSV(HERPES SIMPLEX VRS) I + II AB-IGG
HSV 1 Glycoprotein G Ab, IgG: 0.16 IV
HSV 2 Glycoprotein G Ab, IgG: 0.67 IV

## 2014-03-22 LAB — IPS N GONORRHOEA AND CHLAMYDIA BY PCR

## 2014-03-22 NOTE — Progress Notes (Signed)
Reviewed personally.  M. Suzanne Nuel Dejaynes, MD.  

## 2014-05-02 ENCOUNTER — Ambulatory Visit: Payer: 59 | Admitting: Certified Nurse Midwife

## 2014-05-02 ENCOUNTER — Encounter: Payer: Self-pay | Admitting: Certified Nurse Midwife

## 2014-05-05 ENCOUNTER — Encounter: Payer: Self-pay | Admitting: Certified Nurse Midwife

## 2014-05-05 ENCOUNTER — Ambulatory Visit (INDEPENDENT_AMBULATORY_CARE_PROVIDER_SITE_OTHER): Payer: 59 | Admitting: Certified Nurse Midwife

## 2014-05-05 VITALS — BP 100/70 | HR 72 | Resp 16 | Ht 63.75 in | Wt 167.0 lb

## 2014-05-05 DIAGNOSIS — Z01419 Encounter for gynecological examination (general) (routine) without abnormal findings: Secondary | ICD-10-CM

## 2014-05-05 DIAGNOSIS — Z30011 Encounter for initial prescription of contraceptive pills: Secondary | ICD-10-CM

## 2014-05-05 MED ORDER — NORETHIN ACE-ETH ESTRAD-FE 1-20 MG-MCG(24) PO TABS: 1.0000 | ORAL_TABLET | Freq: Every day | ORAL | Status: DC

## 2014-05-05 NOTE — Progress Notes (Signed)
24 y.o. G0P0000 Single  Caucasian Fe here for annual exam. Periods normal, no problems. OCP working well. No partner change, no STD screening needed. Sees Urgent care if needed. Patient had noticed some redness and odor around umbilicus a week ago, has been cleaning with alcohol with Q tip, and redness has resolved. Please check. No health issues today.  Patient's last menstrual period was 05/02/2014.          Sexually active: Yes.    The current method of family planning is OCP (estrogen/progesterone).    Exercising: No.  exercise Smoker:  no  Health Maintenance: Pap: 04-05-13 neg MMG:  none Colonoscopy:  none BMD:   none TDaP:  2009 Labs: none Self breast exam: done occ   reports that she has never smoked. She has never used smokeless tobacco. She reports that she drinks alcohol. She reports that she does not use illicit drugs.  Past Medical History  Diagnosis Date  . Headache(784.0)     quite a few years ago  . MRSA infection 2009    on skin  . Abnormal Pap smear of cervix 3/11    h/o AGUS pap  . 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 Right ankle injury     Past Surgical History  Procedure Laterality Date  . Wisdom tooth extraction    . Tonsillectomy and adenoidectomy N/A 01/02/2013    Procedure: TONSILLECTOMY ;  Surgeon: Jodi Marble, MD;  Location: [facility];  Service: ENT;  Laterality: N/A;  . Skin cyst excision      left axillary  . Colposcopy      Current Outpatient Prescriptions  Medication Sig Dispense Refill  . cetirizine (ZYRTEC) 10 MG tablet Take 1 tablet (10 mg total) by mouth daily. One tab daily for allergies 30 tablet 1  . Norethindrone Acetate-Ethinyl Estrad-FE (LOESTRIN 24 FE) 1-20 MG-MCG(24) tablet Take 1 tablet by mouth daily. 1 Package 0   No current facility-administered medications for this visit.    Family History  Problem Relation Age of Onset  . Hyperlipidemia Father   . Hypertension Father     ?  Marland Kitchen Heart disease Paternal Grandfather   . Depression  Maternal Uncle     ROS:  Pertinent items are noted in HPI.  Otherwise, a comprehensive ROS was negative.  Exam:   BP 100/70 mmHg  Pulse 72  Resp 16  Ht 5' 3.75" (1.619 m)  Wt 167 lb (75.751 kg)  BMI 28.90 kg/m2  LMP 05/02/2014 Height: 5' 3.75" (161.9 cm) Ht Readings from Last 3 Encounters:  05/05/14 5' 3.75" (1.619 m)  03/18/14 5\' 3"  (1.6 m)  04/05/13 5\' 3"  (1.6 m)    General appearance: alert, cooperative and appears stated age Head: Normocephalic, without obvious abnormality, atraumatic Neck: no adenopathy, supple, symmetrical, trachea midline and thyroid normal to inspection and palpation Lungs: clear to auscultation bilaterally Breasts: normal appearance, no masses or tenderness, No nipple retraction or dimpling, No nipple discharge or bleeding, No axillary or supraclavicular adenopathy Heart: regular rate and rhythm Abdomen: soft, non-tender; no masses,  no organomegaly Umbilicus: no redness, tenderness, exudate with sterile saline swab, normal appearance Extremities: extremities normal, atraumatic, no cyanosis or edema Skin: Skin color, texture, turgor normal. No rashes or lesions Lymph nodes: Cervical, supraclavicular, and axillary nodes normal. No abnormal inguinal nodes palpated Neurologic: Grossly normal   Pelvic: External genitalia:  no lesions              Urethra:  normal appearing urethra with no masses, tenderness or  lesions              Bartholin's and Skene's: normal                 Vagina: normal appearing vagina with normal color and discharge, no lesions              Cervix: normal,non tender,no lesions              Pap taken: No. Bimanual Exam:  Uterus:  normal size, contour, position, consistency, mobility, non-tender              Adnexa: normal adnexa and no mass, fullness, tenderness               Rectovaginal: Confirms               Anus:  normal appearance  Chaperone present: Yes  A:  Well Woman with normal exam  Contraception OCP  Umbilicus  irritation at home not noted today  P:   Reviewed health and wellness pertinent to exam  Rx Loestrin 24 Fe see order  Discussed keeping waist band of clothing off umbilical area, this can cause redness and clean in shower periodically. Come if occurs again.   Pap smear not taken today   counseled on breast self exam, STD prevention, HIV risk factors and prevention, use and side effects of OCP's, adequate intake of calcium and vitamin D, diet and exercise return annually or prn  An After Visit Summary was printed and given to the patient.

## 2014-05-05 NOTE — Patient Instructions (Signed)
General topics  Next pap or exam is  due in 1 year Take a Women's multivitamin Take 1200 mg. of calcium daily - prefer dietary If any concerns in interim to call back  Breast Self-Awareness Practicing breast self-awareness may pick up problems early, prevent significant medical complications, and possibly save your life. By practicing breast self-awareness, you can become familiar with how your breasts look and feel and if your breasts are changing. This allows you to notice changes early. It can also offer you some reassurance that your breast health is good. One way to learn what is normal for your breasts and whether your breasts are changing is to do a breast self-exam. If you find a lump or something that was not present in the past, it is best to contact your caregiver right away. Other findings that should be evaluated by your caregiver include nipple discharge, especially if it is bloody; skin changes or reddening; areas where the skin seems to be pulled in (retracted); or new lumps and bumps. Breast pain is seldom associated with cancer (malignancy), but should also be evaluated by a caregiver. BREAST SELF-EXAM The best time to examine your breasts is 5 7 days after your menstrual period is over.  ExitCare Patient Information 2013 [employer].   Exercise to Stay Healthy Exercise helps you become and stay healthy. EXERCISE IDEAS AND TIPS Choose exercises that:  You enjoy.  Fit into your day. You do not need to exercise really hard to be healthy. You can do exercises at a slow or medium level and stay healthy. You can:  Stretch before and after working out.  Try yoga, Pilates, or tai chi.  Lift weights.  Walk fast, swim, jog, run, climb stairs, bicycle, dance, or rollerskate.  Take aerobic classes. Exercises that burn about 150 calories:  Running 1  miles in 15 minutes.  Playing volleyball for 45 to 60 minutes.  Washing and waxing a car for 45 to 60  minutes.  Playing touch football for 45 minutes.  Walking 1  miles in 35 minutes.  Pushing a stroller 1  miles in 30 minutes.  Playing basketball for 30 minutes.  Raking leaves for 30 minutes.  Bicycling 5 miles in 30 minutes.  Walking 2 miles in 30 minutes.  Dancing for 30 minutes.  Shoveling snow for 15 minutes.  Swimming laps for 20 minutes.  Walking up stairs for 15 minutes.  Bicycling 4 miles in 15 minutes.  Gardening for 30 to 45 minutes.  Jumping rope for 15 minutes.  Washing windows or floors for 45 to 60 minutes. Document Released: 02/02/2010 Document Revised: 03/25/2011 Document Reviewed: 02/02/2010 ExitCare Patient Information 2013 [employer].   Other topics ( that may be useful information):    Sexually Transmitted Disease Sexually transmitted disease (STD) refers to any infection that is passed from person to person during sexual activity. This may happen by way of saliva, semen, blood, vaginal mucus, or urine. Common STDs include:  Gonorrhea.  Chlamydia.  Syphilis.  HIV/AIDS.  Genital herpes.  Hepatitis B and C.  Trichomonas.  Human papillomavirus (HPV).  Pubic lice. CAUSES  An STD may be spread by bacteria, virus, or parasite. A person can get an STD by:  Sexual intercourse with an infected person.  Sharing sex toys with an infected person.  Sharing needles with an infected person.  Having intimate contact with the genitals, mouth, or rectal areas of an infected person. SYMPTOMS  Some people may not have any symptoms, but   they can still pass the infection to others. Different STDs have different symptoms. Symptoms include:  Painful or bloody urination.  Pain in the pelvis, abdomen, vagina, anus, throat, or eyes.  Skin rash, itching, irritation, growths, or sores (lesions). These usually occur in the genital or anal area.  Abnormal vaginal discharge.  Penile discharge in men.  Soft, flesh-colored skin growths in the  genital or anal area.  Fever.  Pain or bleeding during sexual intercourse.  Swollen glands in the groin area.  Yellow skin and eyes (jaundice). This is seen with hepatitis. DIAGNOSIS  To make a diagnosis, your caregiver may:  Take a medical history.  Perform a physical exam.  Take a specimen (culture) to be examined.  Examine a sample of discharge under a microscope.  Perform blood test TREATMENT   Chlamydia, gonorrhea, trichomonas, and syphilis can be cured with antibiotic medicine.  Genital herpes, hepatitis, and HIV can be treated, but not cured, with prescribed medicines. The medicines will lessen the symptoms.  Genital warts from HPV can be treated with medicine or by freezing, burning (electrocautery), or surgery. Warts may come back.  HPV is a virus and cannot be cured with medicine or surgery.However, abnormal areas may be followed very closely by your caregiver and may be removed from the cervix, vagina, or vulva through office procedures or surgery. If your diagnosis is confirmed, your recent sexual partners need treatment. This is true even if they are symptom-free or have a negative culture or evaluation. They should not have sex until their caregiver says it is okay. HOME CARE INSTRUCTIONS  All sexual partners should be informed, tested, and treated for all STDs.  Take your antibiotics as directed. Finish them even if you start to feel better.  Only take over-the-counter or prescription medicines for pain, discomfort, or fever as directed by your caregiver.  Rest.  Eat a balanced diet and drink enough fluids to keep your urine clear or pale yellow.  Do not have sex until treatment is completed and you have followed up with your caregiver. STDs should be checked after treatment.  Keep all follow-up appointments, Pap tests, and blood tests as directed by your caregiver.  Only use latex condoms and water-soluble lubricants during sexual activity. Do not use  petroleum jelly or oils.  Avoid alcohol and illegal drugs.  Get vaccinated for HPV and hepatitis. If you have not received these vaccines in the past, talk to your caregiver about whether one or both might be right for you.  Avoid risky sex practices that can break the skin. The only way to avoid getting an STD is to avoid all sexual activity.Latex condoms and dental dams (for oral sex) will help lessen the risk of getting an STD, but will not completely eliminate the risk. SEEK MEDICAL CARE IF:   You have a fever.  You have any new or worsening symptoms. Document Released: 03/23/2002 Document Revised: 03/25/2011 Document Reviewed: 03/30/2010 [facility] Patient Information 2013 [place].    Domestic Abuse You are being battered or abused if someone close to you hits, pushes, or physically hurts you in any way. You also are being abused if you are forced into activities. You are being sexually abused if you are forced to have sexual contact of any kind. You are being emotionally abused if you are made to feel worthless or if you are constantly threatened. It is important to remember that help is available. No one has the right to abuse you. PREVENTION OF FURTHER  ABUSE  Learn the warning signs of danger. This varies with situations but may include: the use of alcohol, threats, isolation from friends and family, or forced sexual contact. Leave if you feel that violence is going to occur.  If you are attacked or beaten, report it to the police so the abuse is documented. You do not have to press charges. The police can protect you while you or the attackers are leaving. Get the officer's name and badge number and a copy of the report.  Find someone you can trust and tell them what is happening to you: your caregiver, a nurse, clergy member, close friend or family member. Feeling ashamed is natural, but remember that you have done nothing wrong. No one deserves abuse. Document Released:  12/29/1999 Document Revised: 03/25/2011 Document Reviewed: 03/08/2010 ExitCare Patient Information 2013 [employer].    How Much is Too Much Alcohol? Drinking too much alcohol can cause injury, accidents, and health problems. These types of problems can include:   Car crashes.  Falls.  Family fighting (domestic violence).  Drowning.  Fights.  Injuries.  Burns.  Damage to certain organs.  Having a baby with birth defects. ONE DRINK CAN BE TOO MUCH WHEN YOU ARE:  Working.  Pregnant or breastfeeding.  Taking medicines. Ask your doctor.  Driving or planning to drive. If you or someone you know has a drinking problem, get help from a doctor.  Document Released: 10/27/2008 Document Revised: 03/25/2011 Document Reviewed: 10/27/2008 ExitCare Patient Information 2013 [employer].   Smoking Hazards Smoking cigarettes is extremely bad for your health. Tobacco smoke has over 200 known poisons in it. There are over 60 chemicals in tobacco smoke that cause cancer. Some of the chemicals found in cigarette smoke include:   Cyanide.  Benzene.  Formaldehyde.  Methanol (wood alcohol).  Acetylene (fuel used in welding torches).  Ammonia. Cigarette smoke also contains the poisonous gases nitrogen oxide and carbon monoxide.  Cigarette smokers have an increased risk of many serious medical problems and Smoking causes approximately:  90% of all lung cancer deaths in men.  80% of all lung cancer deaths in women.  90% of deaths from chronic obstructive lung disease. Compared with nonsmokers, smoking increases the risk of:  Coronary heart disease by 2 to 4 times.  Stroke by 2 to 4 times.  Men developing lung cancer by 23 times.  Women developing lung cancer by 13 times.  Dying from chronic obstructive lung diseases by 12 times.  . Smoking is the most preventable cause of death and disease in our society.  WHY IS SMOKING ADDICTIVE?  Nicotine is the chemical  agent in tobacco that is capable of causing addiction or dependence.  When you smoke and inhale, nicotine is absorbed rapidly into the bloodstream through your lungs. Nicotine absorbed through the lungs is capable of creating a powerful addiction. Both inhaled and non-inhaled nicotine may be addictive.  Addiction studies of cigarettes and spit tobacco show that addiction to nicotine occurs mainly during the teen years, when young people begin using tobacco products. WHAT ARE THE BENEFITS OF QUITTING?  There are many health benefits to quitting smoking.   Likelihood of developing cancer and heart disease decreases. Health improvements are seen almost immediately.  Blood pressure, pulse rate, and breathing patterns start returning to normal soon after quitting. QUITTING SMOKING   American Lung Association - 1-800-LUNGUSA  American Cancer Society - 1-800-ACS-2345 Document Released: 02/08/2004 Document Revised: 03/25/2011 Document Reviewed: 10/12/2008 ExitCare Patient Information 2013 ExitCare,   LLC.   Stress Management Stress is a state of physical or mental tension that often results from changes in your life or normal routine. Some common causes of stress are:  Death of a loved one.  Injuries or severe illnesses.  Getting fired or changing jobs.  Moving into a new home. Other causes may be:  Sexual problems.  Business or financial losses.  Taking on a large debt.  Regular conflict with someone at home or at work.  Constant tiredness from lack of sleep. It is not just bad things that are stressful. It may be stressful to:  Win the lottery.  Get married.  Buy a new car. The amount of stress that can be easily tolerated varies from person to person. Changes generally cause stress, regardless of the types of change. Too much stress can affect your health. It may lead to physical or emotional problems. Too little stress (boredom) may also become stressful. SUGGESTIONS TO  REDUCE STRESS:  Talk things over with your family and friends. It often is helpful to share your concerns and worries. If you feel your problem is serious, you may want to get help from a professional counselor.  Consider your problems one at a time instead of lumping them all together. Trying to take care of everything at once may seem impossible. List all the things you need to do and then start with the most important one. Set a goal to accomplish 2 or 3 things each day. If you expect to do too many in a single day you will naturally fail, causing you to feel even more stressed.  Do not use alcohol or drugs to relieve stress. Although you may feel better for a short time, they do not remove the problems that caused the stress. They can also be habit forming.  Exercise regularly - at least 3 times per week. Physical exercise can help to relieve that "uptight" feeling and will relax you.  The shortest distance between despair and hope is often a good night's sleep.  Go to bed and get up on time allowing yourself time for appointments without being rushed.  Take a short "time-out" period from any stressful situation that occurs during the day. Close your eyes and take some deep breaths. Starting with the muscles in your face, tense them, hold it for a few seconds, then relax. Repeat this with the muscles in your neck, shoulders, hand, stomach, back and legs.  Take good care of yourself. Eat a balanced diet and get plenty of rest.  Schedule time for having fun. Take a break from your daily routine to relax. HOME CARE INSTRUCTIONS   Call if you feel overwhelmed by your problems and feel you can no longer manage them on your own.  Return immediately if you feel like hurting yourself or someone else. Document Released: 06/26/2000 Document Revised: 03/25/2011 Document Reviewed: 02/16/2007 ExitCare Patient Information 2013 [employer]Care[employer].   

## 2014-05-05 NOTE — Progress Notes (Signed)
Reviewed personally.  M. Suzanne Maaliyah Adolph, MD.  

## 2014-05-08 ENCOUNTER — Emergency Department (INDEPENDENT_AMBULATORY_CARE_PROVIDER_SITE_OTHER)
Admission: EM | Admit: 2014-05-08 | Discharge: 2014-05-08 | Disposition: A | Discharge status: Home / Self Care | Payer: 59 | Source: Home / Self Care | Attending: Emergency Medicine | Admitting: Emergency Medicine

## 2014-05-08 ENCOUNTER — Encounter (HOSPITAL_COMMUNITY): Payer: Self-pay | Admitting: Emergency Medicine

## 2014-05-08 DIAGNOSIS — J069 Acute upper respiratory infection, unspecified: Secondary | ICD-10-CM

## 2014-05-08 DIAGNOSIS — B9789 Other viral agents as the cause of diseases classified elsewhere: Principal | ICD-10-CM

## 2014-05-08 LAB — POCT RAPID STREP A: Streptococcus, Group A Screen (Direct): NEGATIVE

## 2014-05-08 MED ORDER — BENZONATATE 100 MG PO CAPS: 100.0000 mg | ORAL_CAPSULE | Freq: Two times a day (BID) | ORAL | Status: DC | PRN

## 2014-05-08 MED ORDER — IPRATROPIUM BROMIDE 0.06 % NA SOLN: 2.0000 | Freq: Four times a day (QID) | NASAL | Status: DC

## 2014-05-08 NOTE — ED Provider Notes (Signed)
CSN: [id number]     Arrival date & time 05/08/14  1034 History   First MD Initiated Contact with Patient 05/08/14 1241     Chief Complaint  Patient presents with  . URI   (Consider location/radiation/quality/duration/timing/severity/associated sxs/prior Treatment) HPI  She is a 23 year old woman here for evaluation of cough. She states her symptoms started 2 days ago with nasal congestion, rhinorrhea, cough. Her symptoms have gradually been worsening. Today, she reports sinus pressure, cough, and sore throat. She does continue to have nasal congestion and rhinorrhea. No wheezing or shortness of breath. She has had decreased appetite, but is taking fluids well. No fevers or chills.  Past Medical History  Diagnosis Date  . Headache(784.0)     quite a few years ago  . MRSA infection 2009    on skin  . Abnormal Pap smear of cervix 3/11    h/o AGUS pap  . 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 Right ankle injury    Past Surgical History  Procedure Laterality Date  . Wisdom tooth extraction    . Tonsillectomy and adenoidectomy N/A 01/02/2013    Procedure: TONSILLECTOMY ;  Surgeon: Jodi Marble, MD;  Location: [place] [facility];  Service: ENT;  Laterality: N/A;  . Skin cyst excision      left axillary  . Colposcopy     Family History  Problem Relation Age of Onset  . Hyperlipidemia Father   . Hypertension Father     ?  Marland Kitchen Heart disease Paternal Grandfather   . Depression Maternal Uncle    History  Substance Use Topics  . Smoking status: Never Smoker   . Smokeless tobacco: Never Used  . Alcohol Use: 0.0 oz/week    0 Standard drinks or equivalent per week   OB History    Gravida Para Term Preterm AB TAB SAB Ectopic Multiple Living   0 0 0 0 0 0 0 0 0 0      Review of Systems  Constitutional: Positive for appetite change. Negative for fever and chills.  HENT: Positive for congestion, rhinorrhea, sinus pressure and sore throat. Negative for trouble swallowing.   Respiratory: Positive for cough.  Negative for shortness of breath and wheezing.   Gastrointestinal: Negative for nausea and vomiting.  Neurological: Positive for headaches.    Allergies  [name]  Home Medications   Prior to Admission medications   Medication Sig Start Date End Date Taking? Authorizing Provider  Norethindrone Acetate-Ethinyl Estrad-FE (LOESTRIN 24 FE) 1-20 MG-MCG(24) tablet Take 1 tablet by mouth daily. 05/05/14  Yes Regina Eck, CNM  benzonatate (TESSALON) 100 MG capsule Take 1 capsule (100 mg total) by mouth 2 (two) times daily as needed for cough. 05/08/14   Melony Overly, MD  cetirizine (ZYRTEC) 10 MG tablet Take 1 tablet (10 mg total) by mouth daily. One tab daily for allergies 09/28/13   Billy Fischer, MD  ipratropium (ATROVENT) 0.06 % nasal spray Place 2 sprays into both nostrils 4 (four) times daily. 05/08/14   Melony Overly, MD   BP 136/93 mmHg  Pulse 91  Temp(Src) 99.2 F (37.3 C) (Oral)  Resp 16  SpO2 99%  LMP 05/02/2014 Physical Exam  Constitutional: She is oriented to person, place, and time. She appears well-developed and well-nourished. No distress.  HENT:  Right Ear: Tympanic membrane normal.  Left Ear: Tympanic membrane normal.  Nose: Rhinorrhea present.  Mouth/Throat: Posterior oropharyngeal erythema present. No oropharyngeal exudate.    Petechiae in area outlined.  Neck: Neck supple.  Cardiovascular:  Normal rate, regular rhythm and normal heart sounds.   No murmur heard. Pulmonary/Chest: Effort normal and breath sounds normal. No respiratory distress. She has no wheezes. She has no rales.  Lymphadenopathy:    She has no cervical adenopathy.  Neurological: She is alert and oriented to person, place, and time.    ED Course  Procedures (including critical care time) Labs Review Labs Reviewed  POCT RAPID STREP A ([facility] URG CARE ONLY)    Imaging Review No results found.   MDM   1. Viral URI with cough    Symptomatic treatment with Zyrtec, Atrovent, Tessalon. Return  precautions reviewed. Work note provided.    Melony Overly, MD 05/08/14 1344

## 2014-05-08 NOTE — Discharge Instructions (Signed)
You have a virus. Continue to take cetirizine for the next week. Use Atrovent nasal spray 4 times a day for the next week. Use Tessalon 3 times a day as needed for coughing. You can also makes honey with water or lemon juice and take every 2 hours for cough. Get plenty of rest and drink plenty of fluids. Follow-up if no improvement in 3 days.

## 2014-05-08 NOTE — ED Notes (Signed)
C/o  Sore throat.  Chest congestion.  Productive cough.    X 3 days.  No relief with otc meds.    Denies fever, n/v/d.

## 2014-05-10 LAB — CULTURE, GROUP A STREP: Strep A Culture: NEGATIVE

## 2015-05-18 ENCOUNTER — Other Ambulatory Visit: Payer: Self-pay | Admitting: Certified Nurse Midwife

## 2015-05-22 NOTE — Telephone Encounter (Signed)
Medication refill request: Larin 24 FE 1/20  Last AEX:  05/05/14 with DL  Next AEX: No Aex scheduled for 2017 Last MMG (if hormonal medication request): n/a Refill authorized: Please advise.  Left Message To Call Back to schedule AEX.  Routed to [facility] since DL is out of the office today.

## 2015-05-31 ENCOUNTER — Encounter: Payer: Self-pay | Admitting: Certified Nurse Midwife

## 2015-05-31 ENCOUNTER — Ambulatory Visit (INDEPENDENT_AMBULATORY_CARE_PROVIDER_SITE_OTHER): Payer: Managed Care, Other (non HMO) | Admitting: Certified Nurse Midwife

## 2015-05-31 VITALS — BP 104/68 | HR 70 | Resp 16 | Ht 63.25 in | Wt 174.0 lb

## 2015-05-31 DIAGNOSIS — Z0000 Encounter for general adult medical examination without abnormal findings: Secondary | ICD-10-CM | POA: Diagnosis not present

## 2015-05-31 DIAGNOSIS — Z01419 Encounter for gynecological examination (general) (routine) without abnormal findings: Secondary | ICD-10-CM

## 2015-05-31 DIAGNOSIS — Z3041 Encounter for surveillance of contraceptive pills: Secondary | ICD-10-CM | POA: Diagnosis not present

## 2015-05-31 LAB — POCT URINALYSIS DIPSTICK
Bilirubin, UA: NEGATIVE
Blood, UA: NEGATIVE
GLUCOSE UA: NEGATIVE
Ketones, UA: NEGATIVE
LEUKOCYTES UA: NEGATIVE
NITRITE UA: NEGATIVE
Protein, UA: NEGATIVE
UROBILINOGEN UA: NEGATIVE
pH, UA: 5

## 2015-05-31 MED ORDER — NORETHIN ACE-ETH ESTRAD-FE 1-20 MG-MCG(24) PO TABS: 1.0000 | ORAL_TABLET | Freq: Every day | ORAL | Status: DC

## 2015-05-31 NOTE — Patient Instructions (Signed)
General topics  Next pap or exam is  due in 1 year Take a Women's multivitamin Take 1200 mg. of calcium daily - prefer dietary If any concerns in interim to call back  Breast Self-Awareness Practicing breast self-awareness may pick up problems early, prevent significant medical complications, and possibly save your life. By practicing breast self-awareness, you can become familiar with how your breasts look and feel and if your breasts are changing. This allows you to notice changes early. It can also offer you some reassurance that your breast health is good. One way to learn what is normal for your breasts and whether your breasts are changing is to do a breast self-exam. If you find a lump or something that was not present in the past, it is best to contact your caregiver right away. Other findings that should be evaluated by your caregiver include nipple discharge, especially if it is bloody; skin changes or reddening; areas where the skin seems to be pulled in (retracted); or new lumps and bumps. Breast pain is seldom associated with cancer (malignancy), but should also be evaluated by a caregiver. BREAST SELF-EXAM The best time to examine your breasts is 5 7 days after your menstrual period is over.  ExitCare Patient Information 2013 [employer].   Exercise to Stay Healthy Exercise helps you become and stay healthy. EXERCISE IDEAS AND TIPS Choose exercises that:  You enjoy.  Fit into your day. You do not need to exercise really hard to be healthy. You can do exercises at a slow or medium level and stay healthy. You can:  Stretch before and after working out.  Try yoga, Pilates, or tai chi.  Lift weights.  Walk fast, swim, jog, run, climb stairs, bicycle, dance, or rollerskate.  Take aerobic classes. Exercises that burn about 150 calories:  Running 1  miles in 15 minutes.  Playing volleyball for 45 to 60 minutes.  Washing and waxing a car for 45 to 60  minutes.  Playing touch football for 45 minutes.  Walking 1  miles in 35 minutes.  Pushing a stroller 1  miles in 30 minutes.  Playing basketball for 30 minutes.  Raking leaves for 30 minutes.  Bicycling 5 miles in 30 minutes.  Walking 2 miles in 30 minutes.  Dancing for 30 minutes.  Shoveling snow for 15 minutes.  Swimming laps for 20 minutes.  Walking up stairs for 15 minutes.  Bicycling 4 miles in 15 minutes.  Gardening for 30 to 45 minutes.  Jumping rope for 15 minutes.  Washing windows or floors for 45 to 60 minutes. Document Released: 02/02/2010 Document Revised: 03/25/2011 Document Reviewed: 02/02/2010 ExitCare Patient Information 2013 [employer].   Other topics ( that may be useful information):    Sexually Transmitted Disease Sexually transmitted disease (STD) refers to any infection that is passed from person to person during sexual activity. This may happen by way of saliva, semen, blood, vaginal mucus, or urine. Common STDs include:  Gonorrhea.  Chlamydia.  Syphilis.  HIV/AIDS.  Genital herpes.  Hepatitis B and C.  Trichomonas.  Human papillomavirus (HPV).  Pubic lice. CAUSES  An STD may be spread by bacteria, virus, or parasite. A person can get an STD by:  Sexual intercourse with an infected person.  Sharing sex toys with an infected person.  Sharing needles with an infected person.  Having intimate contact with the genitals, mouth, or rectal areas of an infected person. SYMPTOMS  Some people may not have any symptoms, but   they can still pass the infection to others. Different STDs have different symptoms. Symptoms include:  Painful or bloody urination.  Pain in the pelvis, abdomen, vagina, anus, throat, or eyes.  Skin rash, itching, irritation, growths, or sores (lesions). These usually occur in the genital or anal area.  Abnormal vaginal discharge.  Penile discharge in men.  Soft, flesh-colored skin growths in the  genital or anal area.  Fever.  Pain or bleeding during sexual intercourse.  Swollen glands in the groin area.  Yellow skin and eyes (jaundice). This is seen with hepatitis. DIAGNOSIS  To make a diagnosis, your caregiver may:  Take a medical history.  Perform a physical exam.  Take a specimen (culture) to be examined.  Examine a sample of discharge under a microscope.  Perform blood test TREATMENT   Chlamydia, gonorrhea, trichomonas, and syphilis can be cured with antibiotic medicine.  Genital herpes, hepatitis, and HIV can be treated, but not cured, with prescribed medicines. The medicines will lessen the symptoms.  Genital warts from HPV can be treated with medicine or by freezing, burning (electrocautery), or surgery. Warts may come back.  HPV is a virus and cannot be cured with medicine or surgery.However, abnormal areas may be followed very closely by your caregiver and may be removed from the cervix, vagina, or vulva through office procedures or surgery. If your diagnosis is confirmed, your recent sexual partners need treatment. This is true even if they are symptom-free or have a negative culture or evaluation. They should not have sex until their caregiver says it is okay. HOME CARE INSTRUCTIONS  All sexual partners should be informed, tested, and treated for all STDs.  Take your antibiotics as directed. Finish them even if you start to feel better.  Only take over-the-counter or prescription medicines for pain, discomfort, or fever as directed by your caregiver.  Rest.  Eat a balanced diet and drink enough fluids to keep your urine clear or pale yellow.  Do not have sex until treatment is completed and you have followed up with your caregiver. STDs should be checked after treatment.  Keep all follow-up appointments, Pap tests, and blood tests as directed by your caregiver.  Only use latex condoms and water-soluble lubricants during sexual activity. Do not use  petroleum jelly or oils.  Avoid alcohol and illegal drugs.  Get vaccinated for HPV and hepatitis. If you have not received these vaccines in the past, talk to your caregiver about whether one or both might be right for you.  Avoid risky sex practices that can break the skin. The only way to avoid getting an STD is to avoid all sexual activity.Latex condoms and dental dams (for oral sex) will help lessen the risk of getting an STD, but will not completely eliminate the risk. SEEK MEDICAL CARE IF:   You have a fever.  You have any new or worsening symptoms. Document Released: 03/23/2002 Document Revised: 03/25/2011 Document Reviewed: 03/30/2010 [facility] Patient Information 2013 [place].    Domestic Abuse You are being battered or abused if someone close to you hits, pushes, or physically hurts you in any way. You also are being abused if you are forced into activities. You are being sexually abused if you are forced to have sexual contact of any kind. You are being emotionally abused if you are made to feel worthless or if you are constantly threatened. It is important to remember that help is available. No one has the right to abuse you. PREVENTION OF FURTHER  ABUSE  Learn the warning signs of danger. This varies with situations but may include: the use of alcohol, threats, isolation from friends and family, or forced sexual contact. Leave if you feel that violence is going to occur.  If you are attacked or beaten, report it to the police so the abuse is documented. You do not have to press charges. The police can protect you while you or the attackers are leaving. Get the officer's name and badge number and a copy of the report.  Find someone you can trust and tell them what is happening to you: your caregiver, a nurse, clergy member, close friend or family member. Feeling ashamed is natural, but remember that you have done nothing wrong. No one deserves abuse. Document Released:  12/29/1999 Document Revised: 03/25/2011 Document Reviewed: 03/08/2010 ExitCare Patient Information 2013 [employer].    How Much is Too Much Alcohol? Drinking too much alcohol can cause injury, accidents, and health problems. These types of problems can include:   Car crashes.  Falls.  Family fighting (domestic violence).  Drowning.  Fights.  Injuries.  Burns.  Damage to certain organs.  Having a baby with birth defects. ONE DRINK CAN BE TOO MUCH WHEN YOU ARE:  Working.  Pregnant or breastfeeding.  Taking medicines. Ask your doctor.  Driving or planning to drive. If you or someone you know has a drinking problem, get help from a doctor.  Document Released: 10/27/2008 Document Revised: 03/25/2011 Document Reviewed: 10/27/2008 ExitCare Patient Information 2013 [employer].   Smoking Hazards Smoking cigarettes is extremely bad for your health. Tobacco smoke has over 200 known poisons in it. There are over 60 chemicals in tobacco smoke that cause cancer. Some of the chemicals found in cigarette smoke include:   Cyanide.  Benzene.  Formaldehyde.  Methanol (wood alcohol).  Acetylene (fuel used in welding torches).  Ammonia. Cigarette smoke also contains the poisonous gases nitrogen oxide and carbon monoxide.  Cigarette smokers have an increased risk of many serious medical problems and Smoking causes approximately:  90% of all lung cancer deaths in men.  80% of all lung cancer deaths in women.  90% of deaths from chronic obstructive lung disease. Compared with nonsmokers, smoking increases the risk of:  Coronary heart disease by 2 to 4 times.  Stroke by 2 to 4 times.  Men developing lung cancer by 23 times.  Women developing lung cancer by 13 times.  Dying from chronic obstructive lung diseases by 12 times.  . Smoking is the most preventable cause of death and disease in our society.  WHY IS SMOKING ADDICTIVE?  Nicotine is the chemical  agent in tobacco that is capable of causing addiction or dependence.  When you smoke and inhale, nicotine is absorbed rapidly into the bloodstream through your lungs. Nicotine absorbed through the lungs is capable of creating a powerful addiction. Both inhaled and non-inhaled nicotine may be addictive.  Addiction studies of cigarettes and spit tobacco show that addiction to nicotine occurs mainly during the teen years, when young people begin using tobacco products. WHAT ARE THE BENEFITS OF QUITTING?  There are many health benefits to quitting smoking.   Likelihood of developing cancer and heart disease decreases. Health improvements are seen almost immediately.  Blood pressure, pulse rate, and breathing patterns start returning to normal soon after quitting. QUITTING SMOKING   American Lung Association - 1-800-LUNGUSA  American Cancer Society - 1-800-ACS-2345 Document Released: 02/08/2004 Document Revised: 03/25/2011 Document Reviewed: 10/12/2008 ExitCare Patient Information 2013 ExitCare,   LLC.   Stress Management Stress is a state of physical or mental tension that often results from changes in your life or normal routine. Some common causes of stress are:  Death of a loved one.  Injuries or severe illnesses.  Getting fired or changing jobs.  Moving into a new home. Other causes may be:  Sexual problems.  Business or financial losses.  Taking on a large debt.  Regular conflict with someone at home or at work.  Constant tiredness from lack of sleep. It is not just bad things that are stressful. It may be stressful to:  Win the lottery.  Get married.  Buy a new car. The amount of stress that can be easily tolerated varies from person to person. Changes generally cause stress, regardless of the types of change. Too much stress can affect your health. It may lead to physical or emotional problems. Too little stress (boredom) may also become stressful. SUGGESTIONS TO  REDUCE STRESS:  Talk things over with your family and friends. It often is helpful to share your concerns and worries. If you feel your problem is serious, you may want to get help from a professional counselor.  Consider your problems one at a time instead of lumping them all together. Trying to take care of everything at once may seem impossible. List all the things you need to do and then start with the most important one. Set a goal to accomplish 2 or 3 things each day. If you expect to do too many in a single day you will naturally fail, causing you to feel even more stressed.  Do not use alcohol or drugs to relieve stress. Although you may feel better for a short time, they do not remove the problems that caused the stress. They can also be habit forming.  Exercise regularly - at least 3 times per week. Physical exercise can help to relieve that "uptight" feeling and will relax you.  The shortest distance between despair and hope is often a good night's sleep.  Go to bed and get up on time allowing yourself time for appointments without being rushed.  Take a short "time-out" period from any stressful situation that occurs during the day. Close your eyes and take some deep breaths. Starting with the muscles in your face, tense them, hold it for a few seconds, then relax. Repeat this with the muscles in your neck, shoulders, hand, stomach, back and legs.  Take good care of yourself. Eat a balanced diet and get plenty of rest.  Schedule time for having fun. Take a break from your daily routine to relax. HOME CARE INSTRUCTIONS   Call if you feel overwhelmed by your problems and feel you can no longer manage them on your own.  Return immediately if you feel like hurting yourself or someone else. Document Released: 06/26/2000 Document Revised: 03/25/2011 Document Reviewed: 02/16/2007 ExitCare Patient Information 2013 [employer]Care[employer].   

## 2015-05-31 NOTE — Progress Notes (Signed)
Reviewed personally.  M. Suzanne Quadarius Henton, MD.  

## 2015-05-31 NOTE — Progress Notes (Signed)
25 y.o. G0P0000 Single  Caucasian Fe here for annual exam. Periods normal, took a break from [facility] for 2 months will resume this month. No partner change, no STD concerns. Sees PCP prn for sinus this year. Plans aex with labs later this year. Busy with work. No health issues today. Planning trip to [facility]!  Patient's last menstrual period was 05/10/2015.          Sexually active: Yes.    The current method of family planning is condom use all the time.    Exercising: No.  exercise Smoker:  no  Health Maintenance: Pap:  04-05-13 neg hx of AGUS & colpo done 2011 MMG:  none Colonoscopy:  none BMD:   none TDaP:  2009 Shingles: no Pneumonia: no Hep C and HIV: HIV neg 2015 Labs: poct urine-neg Self breast exam: not done   reports that she has never smoked. She has never used smokeless tobacco. She reports that she drinks about 2.4 oz of alcohol per week. She reports that she does not use illicit drugs.  Past Medical History  Diagnosis Date  . Headache(784.0)     quite a few years ago  . MRSA infection 2009    on skin  . Abnormal Pap smear of cervix 3/11    h/o AGUS pap  . 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 Right ankle injury     Past Surgical History  Procedure Laterality Date  . Wisdom tooth extraction    . Tonsillectomy and adenoidectomy N/A 01/02/2013    Procedure: TONSILLECTOMY ;  Surgeon: Jodi Marble, MD;  Location: [facility];  Service: ENT;  Laterality: N/A;  . Skin cyst excision      left axillary  . Colposcopy      Current Outpatient Prescriptions  Medication Sig Dispense Refill  . UNABLE TO FIND Eczema cream prn    . LARIN 24 FE 1-20 MG-MCG(24) tablet TAKE 1 TABLET BY MOUTH DAILY. (Patient not taking: Reported on 05/31/2015) 28 tablet 0   No current facility-administered medications for this visit.    Family History  Problem Relation Age of Onset  . Hyperlipidemia Father   . Hypertension Father     ?  Marland Kitchen Heart disease Paternal Grandfather   . Depression Maternal Uncle      ROS:  Pertinent items are noted in HPI.  Otherwise, a comprehensive ROS was negative.  Exam:   BP 104/68 mmHg  Pulse 70  Resp 16  Ht 5' 3.25" (1.607 m)  Wt 174 lb (78.926 kg)  BMI 30.56 kg/m2  LMP 05/10/2015 Height: 5' 3.25" (160.7 cm) Ht Readings from Last 3 Encounters:  05/31/15 5' 3.25" (1.607 m)  05/05/14 5' 3.75" (1.619 m)  03/18/14 5\' 3"  (1.6 m)    General appearance: alert, cooperative and appears stated age Head: Normocephalic, without obvious abnormality, atraumatic Neck: no adenopathy, supple, symmetrical, trachea midline and thyroid normal to inspection and palpation Lungs: clear to auscultation bilaterally Breasts: normal appearance, no masses or tenderness, No nipple retraction or dimpling, No nipple discharge or bleeding, No axillary or supraclavicular adenopathy Heart: regular rate and rhythm Abdomen: soft, non-tender; no masses,  no organomegaly Extremities: extremities normal, atraumatic, no cyanosis or edema Skin: Skin color, texture, turgor normal. No rashes or lesions Lymph nodes: Cervical, supraclavicular, and axillary nodes normal. No abnormal inguinal nodes palpated Neurologic: Grossly normal   Pelvic: External genitalia:  no lesions              Urethra:  normal appearing urethra with  no masses, tenderness or lesions              Bartholin's and Skene's: normal                 Vagina: normal appearing vagina with normal color and discharge, no lesions              Cervix: no cervical motion tenderness, no lesions and nulliparous appearance              Pap taken: No. Bimanual Exam:  Uterus:  normal size, contour, position, consistency, mobility, non-tender              Adnexa: normal adnexa and no mass, fullness, tenderness               Rectovaginal: Confirms               Anus:  normal appearance  Chaperone present: yes  A:  Well Woman with normal exam  Contraception condoms, but plans to restart OCP  P:   Reviewed health and wellness  pertinent to exam  Rx Larin 24 Fe see order with instructions  Pap smear as above not taken   counseled on breast self exam, STD prevention, HIV risk factors and prevention, use and side effects of OCP's, adequate intake of calcium and vitamin D, diet and exercise  return annually or prn  An After Visit Summary was printed and given to the patient.

## 2015-11-04 DIAGNOSIS — Y999 Unspecified external cause status: Secondary | ICD-10-CM | POA: Insufficient documentation

## 2015-11-04 DIAGNOSIS — S60222A Contusion of left hand, initial encounter: Secondary | ICD-10-CM | POA: Insufficient documentation

## 2015-11-04 DIAGNOSIS — S060X1A Concussion with loss of consciousness of 30 minutes or less, initial encounter: Secondary | ICD-10-CM | POA: Diagnosis not present

## 2015-11-04 DIAGNOSIS — Y929 Unspecified place or not applicable: Secondary | ICD-10-CM | POA: Insufficient documentation

## 2015-11-04 DIAGNOSIS — S060X9A Concussion with loss of consciousness of unspecified duration, initial encounter: Secondary | ICD-10-CM

## 2015-11-04 DIAGNOSIS — Y939 Activity, unspecified: Secondary | ICD-10-CM | POA: Diagnosis not present

## 2015-11-04 DIAGNOSIS — W208XXA Other cause of strike by thrown, projected or falling object, initial encounter: Secondary | ICD-10-CM | POA: Diagnosis not present

## 2015-11-04 DIAGNOSIS — S0990XA Unspecified injury of head, initial encounter: Secondary | ICD-10-CM | POA: Diagnosis not present

## 2015-11-04 DIAGNOSIS — S060XAA Concussion with loss of consciousness status unknown, initial encounter: Secondary | ICD-10-CM

## 2015-11-04 HISTORY — DX: Concussion with loss of consciousness status unknown, initial encounter: S06.0XAA

## 2015-11-04 HISTORY — DX: Concussion with loss of consciousness of unspecified duration, initial encounter: S06.0X9A

## 2015-11-05 ENCOUNTER — Emergency Department (HOSPITAL_COMMUNITY): Payer: Managed Care, Other (non HMO)

## 2015-11-05 ENCOUNTER — Encounter (HOSPITAL_COMMUNITY): Payer: Self-pay | Admitting: Nurse Practitioner

## 2015-11-05 ENCOUNTER — Emergency Department (HOSPITAL_COMMUNITY)
Admission: EM | Admit: 2015-11-05 | Discharge: 2015-11-05 | Disposition: A | Discharge status: Home / Self Care | Payer: Managed Care, Other (non HMO) | Attending: Emergency Medicine | Admitting: Emergency Medicine

## 2015-11-05 DIAGNOSIS — S60222A Contusion of left hand, initial encounter: Secondary | ICD-10-CM

## 2015-11-05 DIAGNOSIS — S060X1A Concussion with loss of consciousness of 30 minutes or less, initial encounter: Secondary | ICD-10-CM

## 2015-11-05 DIAGNOSIS — S0990XA Unspecified injury of head, initial encounter: Secondary | ICD-10-CM

## 2015-11-05 MED ORDER — ONDANSETRON 4 MG PO TBDP: 4.0000 mg | ORAL_TABLET | Freq: Three times a day (TID) | ORAL | 0 refills | Status: DC | PRN

## 2015-11-05 MED ORDER — IBUPROFEN 800 MG PO TABS: 800.0000 mg | ORAL_TABLET | Freq: Three times a day (TID) | ORAL | 0 refills | Status: DC | PRN

## 2015-11-05 MED ORDER — OXYCODONE-ACETAMINOPHEN 5-325 MG PO TABS
1.0000 | ORAL_TABLET | Freq: Four times a day (QID) | ORAL | 0 refills | Status: DC | PRN
Start: 2015-11-05 — End: 2015-11-21

## 2015-11-05 MED ORDER — OXYCODONE-ACETAMINOPHEN 5-325 MG PO TABS
2.0000 | ORAL_TABLET | Freq: Once | ORAL | Status: AC
  Administered 2015-11-05: 2 via ORAL
  Filled 2015-11-05: qty 2

## 2015-11-05 NOTE — ED Triage Notes (Signed)
Pt reports that a heavy weight base speaker fell on her head leading to head injury and LOC. She also reports left hand little finger injury and swelling.

## 2015-11-05 NOTE — ED Provider Notes (Signed)
By signing my name below, I, Kinzi Drum, attest that this documentation has been prepared under the direction and in the presence of [place], DO. Electronically Signed: Gwenlyn Fudge, ED Scribe. 11/05/15. 1:16 AM.   TIME SEEN: 12:49 AM   CHIEF COMPLAINT: Head and Left hand injury  HPI: Jenna Lane is a 25 y.o. female with no significant past medical history who presents to the Emergency Department complaining of constant pain to the top of her head s/p head injury earlier tonight. Pt was at [facility] when she was was struck on the top of the head by a heavy base speaker that fell. She states she lost consciousness when she was struck and reports associated headache. Pt reports left 5th finger pain as well but no other injury. Denies numbness, tingling, focal weakness. Is not on antiplatelet or anticoagulation. Reports she has been drinking some alcohol tonight.  ROS: See HPI Constitutional: no fever  Eyes: no drainage  ENT: no runny nose   Cardiovascular:  no chest pain  Resp: no SOB  GI: no vomiting GU: no dysuria Integumentary: no rash  Allergy: no hives  Musculoskeletal: no leg swelling  Neurological: no slurred speech ROS otherwise negative  PAST MEDICAL HISTORY/PAST SURGICAL HISTORY:  Past Medical History:  Diagnosis Date  . Abnormal Pap smear of cervix 3/11   h/o AGUS pap  . Headache(784.0)    quite a few years ago  . 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 MRSA infection 2009   on skin  . Right ankle injury    MEDICATIONS:  Prior to Admission medications   Medication Sig Start Date End Date Taking? Authorizing Provider  Norethindrone Acetate-Ethinyl Estrad-FE (LARIN 24 FE) 1-20 MG-MCG(24) tablet Take 1 tablet by mouth daily. 05/31/15   Regina Eck, CNM  UNABLE TO FIND Eczema cream prn    Historical Provider, MD    ALLERGIES:  Allergies  Allergen Reactions  . [name] [Drospirenone-Ethinyl Estradiol] Rash    SOCIAL HISTORY:  Social History  Substance Use  Topics  . Smoking status: Never Smoker  . Smokeless tobacco: Never Used  . Alcohol use 2.4 oz/week    4 Standard drinks or equivalent per week    FAMILY HISTORY: Family History  Problem Relation Age of Onset  . Hyperlipidemia Father   . Hypertension Father     ?  Marland Kitchen Heart disease Paternal Grandfather   . Depression Maternal Uncle    EXAM: CONSTITUTIONAL: Alert and oriented and responds appropriately to questions. Well-appearing; well-nourished; GCS 15 HEAD: Normocephalic; Tender to the top of her head without laceration or hematoma EYES: ConjunctivaeSlightly injected, PERRL, EOMI ENT: normal nose; no rhinorrhea; moist mucous membranes; pharynx without lesions noted; no dental injury; no septal hematoma NECK: Supple, no meningismus, no LAD; no midline spinal tenderness, step-off or deformity CARD: Regular, tachycardic; S1 and S2 appreciated; no murmurs, no clicks, no rubs, no gallops RESP: Normal chest excursion without splinting or tachypnea; breath sounds clear and equal bilaterally; no wheezes, no rhonchi, no rales; no hypoxia or respiratory distress CHEST:  chest wall stable, no crepitus or ecchymosis or deformity, nontender to palpation ABD/GI: Normal bowel sounds; non-distended; soft, non-tender, no rebound, no guarding PELVIS:  stable, nontender to palpation BACK:  The back appears normal and is non-tender to palpation, there is no CVA tenderness; no midline spinal tenderness, step-off or deformity EXT: tender to palpation over the left 5th finger with no obvious deformity, ecchymosis or swelling. Normal ROM in all joints otherwise extremities are non-tender  to palpation; no edema; normal capillary refill; no cyanosis, no bony deformity of patient's extremities, no joint effusion, no ecchymosis or lacerations    SKIN: Normal color for age and race; warm NEURO: Moves all extremities equally, sensation to light touch intact diffusely, cranial nerves II through XII intact PSYCH: The  patient's mood and manner are appropriate. Grooming and personal hygiene are appropriate.  MEDICAL DECISION MAKING: Patient here with significant head injury with loss of consciousness. We'll obtain had CT and cervical spine CT. No vomiting or neurologic deficits. Also complaining of left fifth digit pain. X-rays have been reviewed and I see no fracture. She has full range of motion in this digit but keeps it in a slightly flexed position. We'll treat him with Percocet and reassessed.  ED PROGRESS: 2:30 AM  Pt reports feeling much better. Head and cervical spine CT showed a scalp hematoma but no other acute abnormality. Still neurologically intact. I feel she is safe to be discharged home. We'll discharge with ibuprofen, Percocet and Zofran. We'll provide with work note as needed. I offered to splint her finger but she declines. I have recommended she avoid any activity that may lead to another head injury for the next week or until her symptoms have completely resolved. I recommended she avoid cell phones, television, and tablets, computers, alcohol for the next several days. Recommend increased water intake and rest. Family is at bedside to drive her home. Discussed at length head injury return precautions.   At this time, I do not feel there is any life-threatening condition present. I have reviewed and discussed all results (EKG, imaging, lab, urine as appropriate), exam findings with patient/family. I have reviewed nursing notes and appropriate previous records.  I feel the patient is safe to be discharged home without further emergent workup and can continue workup as an outpatient as needed. Discussed usual and customary return precautions. Patient/family verbalize understanding and are comfortable with this plan.  Outpatient follow-up has been provided. All questions have been answered.      I personally performed the services described in this documentation, which was scribed in my presence. The  recorded information has been reviewed and is accurate.      [place], DO 11/05/15 [phone]

## 2015-11-21 ENCOUNTER — Ambulatory Visit (INDEPENDENT_AMBULATORY_CARE_PROVIDER_SITE_OTHER): Payer: Managed Care, Other (non HMO) | Admitting: Neurology

## 2015-11-21 ENCOUNTER — Encounter: Payer: Self-pay | Admitting: Neurology

## 2015-11-21 VITALS — BP 113/76 | HR 65 | Ht 63.0 in | Wt 182.2 lb

## 2015-11-21 DIAGNOSIS — S134XXA Sprain of ligaments of cervical spine, initial encounter: Secondary | ICD-10-CM

## 2015-11-21 DIAGNOSIS — F0781 Postconcussional syndrome: Secondary | ICD-10-CM | POA: Diagnosis not present

## 2015-11-21 MED ORDER — CYCLOBENZAPRINE HCL 10 MG PO TABS: 10.0000 mg | ORAL_TABLET | Freq: Three times a day (TID) | ORAL | 3 refills | Status: DC | PRN

## 2015-11-21 NOTE — Patient Instructions (Addendum)
Remember to drink plenty of fluid, eat healthy meals and do not skip any meals. Try to eat protein with a every meal and eat a healthy snack such as fruit or nuts in between meals. Try to keep a regular sleep-wake schedule and try to exercise daily, particularly in the form of walking, 20-30 minutes a day, if you can.   Flexeril 10mg  at night or up to 3x a day  I would like to see you back in 3 months, sooner if we need to. Please call us with any interim questions, concerns, problems, updates or refill requests.   Our phone number is [phone]. We also have an after hours call service for urgent matters and there is a physician on-call for urgent questions. For any emergencies you know to call 911 or go to the nearest emergency room  Cyclobenzaprine tablets What is this medicine? CYCLOBENZAPRINE (sye kloe BEN za preen) is a muscle relaxer. It is used to treat muscle pain, spasms, and stiffness. This medicine may be used for other purposes; ask your health care provider or pharmacist if you have questions. What should I tell my health care provider before I take this medicine? They need to know if you have any of these conditions: -heart disease, irregular heartbeat, or previous heart attack -liver disease -thyroid problem -an unusual or allergic reaction to cyclobenzaprine, tricyclic antidepressants, lactose, other medicines, foods, dyes, or preservatives -pregnant or trying to get pregnant -breast-feeding How should I use this medicine? Take this medicine by mouth with a glass of water. Follow the directions on the prescription label. If this medicine upsets your stomach, take it with food or milk. Take your medicine at regular intervals. Do not take it more often than directed. Talk to your pediatrician regarding the use of this medicine in children. Special care may be needed. Overdosage: If you think you have taken too much of this medicine contact a poison control center or emergency  room at once. NOTE: This medicine is only for you. Do not share this medicine with others. What if I miss a dose? If you miss a dose, take it as soon as you can. If it is almost time for your next dose, take only that dose. Do not take double or extra doses. What may interact with this medicine? Do not take this medicine with any of the following medications: -certain medicines for fungal infections like fluconazole, itraconazole, ketoconazole, posaconazole, voriconazole -cisapride -dofetilide -dronedarone -droperidol -flecainide -grepafloxacin -halofantrine -levomethadyl -MAOIs like Carbex, Eldepryl, Marplan, Nardil, and Parnate -nilotinib -pimozide -probucol -sertindole -thioridazine -ziprasidone This medicine may also interact with the following medications: -abarelix -alcohol -certain medicines for cancer -certain medicines for depression, anxiety, or psychotic disturbances -certain medicines for infection like alfuzosin, chloroquine, clarithromycin, levofloxacin, mefloquine, pentamidine, troleandomycin -certain medicines for an irregular heart beat -certain medicines used for sleep or numbness during surgery or procedure -contrast dyes -dolasetron -guanethidine -methadone -octreotide -ondansetron -other medicines that prolong the QT interval (cause an abnormal heart rhythm) -palonosetron -phenothiazines like chlorpromazine, mesoridazine, prochlorperazine, thioridazine -tramadol -vardenafil This list may not describe all possible interactions. Give your health care provider a list of all the medicines, herbs, non-prescription drugs, or dietary supplements you use. Also tell them if you smoke, drink alcohol, or use illegal drugs. Some items may interact with your medicine. What should I watch for while using this medicine? Check with your doctor or health care professional if your condition does not improve within 1 to 3 weeks. You may get drowsy or  dizzy when you first  start taking the medicine or change doses. Do not drive, use machinery, or do anything that may be dangerous until you know how the medicine affects you. Stand or sit up slowly. Your mouth may get dry. Drinking water, chewing sugarless gum, or sucking on hard candy may help. What side effects may I notice from receiving this medicine? Side effects that you should report to your doctor or health care professional as soon as possible: -allergic reactions like skin rash, itching or hives, swelling of the face, lips, or tongue -chest pain -fast heartbeat -hallucinations -seizures -vomiting Side effects that usually do not require medical attention (report to your doctor or health care professional if they continue or are bothersome): -headache This list may not describe all possible side effects. Call your doctor for medical advice about side effects. You may report side effects to FDA at 1-800-FDA-1088. Where should I keep my medicine? Keep out of the reach of children. Store at room temperature between 15 and 30 degrees C (59 and 86 degrees F). Keep container tightly closed. Throw away any unused medicine after the expiration date. NOTE: This sheet is a summary. It may not cover all possible information. If you have questions about this medicine, talk to your doctor, pharmacist, or health care provider.    2016, [employer]. (2012-07-28 12:48:19)

## 2015-11-21 NOTE — Progress Notes (Signed)
Marland Kitchen  [facility]    Provider:  Dr Jaynee Eagles Referring Provider: Shon Baton, MD Primary Care Physician:  Precious Reel, MD  CC:  Head trauma  HPI:  Jenna Lane is a 25 y.o. female here as a referral from Dr. Virgina Jock for head trauma. She was sitting on a bar stool and a speaker fell out of the ceiling onto her head. She lost consciousness for a brief time. She doesn't remember several minutes around the incident. EMS was not called . Her friend drove her to the ED, she is foggy about exactly what happened and she doesn't remember about 15 minutes. She had a headache for 9 days straight treated with ibuprofen and excedrin. Initially the headache was severe, daily, constant, 9/10 pain, worse with looking at the computer or with doing any tasks at all. She slept for several days afterwards and missed days of work, extreme fatigie and dizziness with severe headaches. Couldn't get out of bed. Couldn't go to work. Symptoms have slowly improved since then but there are still residual deficits currently. She has ringing in the ears.  She had dizziness especially when standing, which has mostly resolved. She was severely fatigued the first week to 10 days which is improving. She has had sleep disturbances and has been having crazy dreams/nightmares at night waking her. No significant changes in mood, She has some decreased memory more so concentration and she could not complete tasks or do much of anything the first 10 days. Headaches are improved but still with residual headache when working on the computer too long, when watching tv too long, bright lights can trigger headaches, needs to take breaks often, she has continued tenderness on the top of her head where the speaker hit her. No irritability or significant mood changes. She has ringing in the ears less frequently now. She still has daily headaches that can be 4/10 in pain and can last several hours and linger which are improved with taking a  break. She is improving slowly but she still has problems that she is dealing with. This has caused a lot of stress, being out of work, she has had to use sick time at work, she was unable to care for her new puppy and had to find daycare for him. Her parents had to care for her, they have been at her house helping with daily chores she was not able to complete such as laundry, cooking, cleaning, driving to the grocery store. No confusion. She continues to have some sleep disturbances she never had before. She is improving but still symptoms persist. She also had neck pain, soreness, tightness and heat and ice have helped. Her back is hurting, herneck is very tight. No other associated symptoms or modifiable factors.   Reviewed notes, labs and imaging from outside physicians, which showed:  Reviewed labs: cmp normal  Personally reviewed CT images and agree with the following:  CT HEAD FINDINGS  BRAIN: The ventricles and sulci are normal. No intraparenchymal hemorrhage, mass effect nor midline shift. No acute large vascular territory infarcts. No abnormal extra-axial fluid collections. Basal cisterns are patent.  VASCULAR: Unremarkable.  SKULL/SOFT TISSUES: No skull fracture. Small frontal scalp hematoma without subcutaneous gas or radiopaque foreign bodies.  ORBITS/SINUSES: The included ocular globes and orbital contents are normal.The mastoid air-cells and included paranasal sinuses are well-aerated.  OTHER: None.  CT CERVICAL SPINE FINDINGS  ALIGNMENT: Vertebral bodies in alignment.  Straightened lordosis.  SKULL BASE AND VERTEBRAE: Cervical vertebral bodies  and posterior elements are intact. Intervertebral disc heights preserved. No destructive bony lesions. C1-2 articulation maintained.  SOFT TISSUES AND SPINAL CANAL: Normal.  DISC LEVELS: No significant osseous canal stenosis or neural foraminal narrowing.  Review of Systems: Patient complains of symptoms per  HPI as well as the following symptoms: np CP, no SOB. Pertinent negatives per HPI. All others negative.   Social History   Social History  . Marital status: Single    Spouse name: N/A  . Number of children: 0  . Years of education: Associates   Occupational History  . [employer]- xray tech    Social History Main Topics  . Smoking status: Never Smoker  . Smokeless tobacco: Never Used  . Alcohol use 2.4 oz/week    4 Standard drinks or equivalent per week     Comment: 3-4 drinks weekly  . Drug use: No  . Sexual activity: Yes    Partners: Male    Birth control/ protection: Pill   Other Topics Concern  . Not on file   Social History Narrative   Lives alone   Caffeine use: 1 cup daily    Family History  Problem Relation Age of Onset  . Hyperlipidemia Father   . Hypertension Father     ?  Marland Kitchen Heart disease Paternal Grandfather   . Depression Maternal Uncle   . Lung cancer Maternal Grandfather     Past Medical History:  Diagnosis Date  . Abnormal Pap smear of cervix 3/11   h/o AGUS pap  . Headache(784.0)    quite a few years ago  . 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 MRSA infection 2009   on skin  . Right ankle injury     Past Surgical History:  Procedure Laterality Date  . COLPOSCOPY    . skin cyst excision     left axillary  . TONSILLECTOMY AND ADENOIDECTOMY N/A 01/02/2013   Procedure: TONSILLECTOMY ;  Surgeon: Jodi Marble, MD;  Location: [facility];  Service: ENT;  Laterality: N/A;  . WISDOM TOOTH EXTRACTION      Current Outpatient Prescriptions  Medication Sig Dispense Refill  . cyclobenzaprine (FLEXERIL) 10 MG tablet Take 1 tablet (10 mg total) by mouth 3 (three) times daily as needed for muscle spasms. 90 tablet 3   No current facility-administered medications for this visit.     Allergies as of 11/21/2015 - Review Complete 11/21/2015  Allergen Reaction Noted  . [name] [drospirenone-ethinyl estradiol] Rash 03/18/2014    Vitals: BP 113/76 (BP  Location: Right Arm, Patient Position: Sitting, Cuff Size: Normal)   Pulse 65   Ht 5\' 3"  (1.6 m)   Wt 182 lb 3.2 oz (82.6 kg)   LMP 10/21/2015 Comment: patient shielded.  BMI 32.28 kg/m  Last Weight:  Wt Readings from Last 1 Encounters:  11/21/15 182 lb 3.2 oz (82.6 kg)   Last Height:   Ht Readings from Last 1 Encounters:  11/21/15 5\' 3"  (1.6 m)    Physical exam: Exam: Gen: NAD, conversant, well nourised, well groomed                     CV: RRR, no MRG. No Carotid Bruits. No peripheral edema, warm, nontender Eyes: Conjunctivae clear without exudates or hemorrhage  Neuro: Detailed Neurologic Exam  Speech:    Speech is normal; fluent and spontaneous with normal comprehension.  Cognition:    The patient is oriented to person, place, and time;     recent and remote memory intact;  language fluent;     normal attention, concentration,     fund of knowledge Cranial Nerves:    The pupils are equal, round, and reactive to light. The fundi are normal and spontaneous venous pulsations are present. Visual fields are full to finger confrontation. Extraocular movements are intact. Trigeminal sensation is intact and the muscles of mastication are normal. The face is symmetric. The palate elevates in the midline. Hearing intact. Voice is normal. Shoulder shrug is normal. The tongue has normal motion without fasciculations.   Coordination:    Normal finger to nose and heel to shin. Normal rapid alternating movements.   Gait:    Heel-toe and tandem gait are normal.   Motor Observation:    No asymmetry, no atrophy, and no involuntary movements noted. Tone:    Normal muscle tone.    Posture:    Posture is normal. normal erect    Strength:    Strength is V/V in the upper and lower limbs.      Sensation: intact to LT     Reflex Exam:  DTR's:    Deep tendon reflexes in the upper and lower extremities are normal bilaterally.   Toes:    The toes are downgoing bilaterally.     Clonus:    Clonus is absent.      Assessment/Plan:  25 year old with post-concussive syndrome(PCS) and neck pain due to speaker falling on head. PCS can significantly affect daily functioning. Neuro exam normal and CT of the head and neck are unremarkable but this does not exclude a diagnosis of PCS as imaging of the pain is often normal in concussion. Discussed with patient at length. Rest is important in concussion recovery. Recommend shortened work days, working from home if she can, taking frequent breaks. No strenuous activity, limiting computer and reading time. Usual recovery for first-time concussions is 3-6 months, often though symptoms will remain upwards to a year and can become chronic. Neck pain also due to injury from a speaker falling on her head.Continue heat and stretching, may need physical therapy and manual massage will provide flexeril for muscle pain. Since other symptoms of PCS are improving do not recommend other medications at this time but patient should be followed up in 3 months for re-assessment.   Sarina Ill, MD  [facility] [address] [place], [place] [address]  Phone [phone] Fax [phone]

## 2015-11-22 DIAGNOSIS — F0781 Postconcussional syndrome: Secondary | ICD-10-CM | POA: Insufficient documentation

## 2016-02-27 ENCOUNTER — Telehealth: Payer: Self-pay

## 2016-02-27 ENCOUNTER — Encounter: Payer: Self-pay | Admitting: Neurology

## 2016-02-27 ENCOUNTER — Ambulatory Visit (INDEPENDENT_AMBULATORY_CARE_PROVIDER_SITE_OTHER): Payer: Managed Care, Other (non HMO) | Admitting: Neurology

## 2016-02-27 VITALS — BP 113/74 | HR 71 | Ht 62.0 in | Wt 182.0 lb

## 2016-02-27 DIAGNOSIS — G44309 Post-traumatic headache, unspecified, not intractable: Secondary | ICD-10-CM | POA: Diagnosis not present

## 2016-02-27 MED ORDER — TOPIRAMATE 25 MG PO TABS: 50.0000 mg | ORAL_TABLET | Freq: Two times a day (BID) | ORAL | 6 refills | Status: DC

## 2016-02-27 NOTE — Telephone Encounter (Signed)
Pt requested copy of medical records. PHI release form signed and new patient visit notes from Nov given to pt.. She will call/come back at the beginning of next wk for today's notes after dictated and for any pertinent billing information.

## 2016-02-27 NOTE — Progress Notes (Signed)
[id number]    Provider:  Dr Jaynee Eagles Referring Provider: Shon Baton, MD Primary Care Physician:  Precious Reel, MD  [facility]   CC:  Head trauma  Interval history 02/27/2016: She feels improved. Light has bothered her more. She is having some worsening vision in the right eye, episodes of blurry vision. She still has headaches a few times a week. Usually on the right side, pressure, no nausea or vomiting, light can bother her, The headaches can last a few hours and they can be mild to moderate in severity. They usually go away on their own with rest. No more dizziness or sleep disturbances, memory and concentration is fine. Still having headaches exacerbated by using computer too long or with fatigue.Neck pain is better. Discussed this is likely post-concussive headache and recommend treatment at this time with a daily preventative. Discussed options and headache management. Decided on Topiramate.  HPI:  Jenna Lane is a 26 y.o. female here as a referral from Dr. Virgina Jock for head trauma. She was sitting on a bar stool and a speaker fell out of the ceiling onto her head. She lost consciousness for a brief time. She doesn't remember several minutes around the incident. EMS was not called . Her friend drove her to the ED, she is foggy about exactly what happened and she doesn't remember about 15 minutes. She had a headache for 9 days straight treated with ibuprofen and excedrin. Initially the headache was severe, daily, constant, 9/10 pain, worse with looking at the computer or with doing any tasks at all. She slept for several days afterwards and missed days of work, extreme fatigie and dizziness with severe headaches. Couldn't get out of bed. Couldn't go to work. Symptoms have slowly improved since then but there are still residual deficits currently. She has ringing in the ears.  She had dizziness especially when standing, which has mostly resolved. She was  severely fatigued the first week to 10 days which is improving. She has had sleep disturbances and has been having crazy dreams/nightmares at night waking her. No significant changes in mood, She has some decreased memory more so concentration and she could not complete tasks or do much of anything the first 10 days. Headaches are improved but still with residual headache when working on the computer too long, when watching tv too long, bright lights can trigger headaches, needs to take breaks often, she has continued tenderness on the top of her head where the speaker hit her. No irritability or significant mood changes. She has ringing in the ears less frequently now. She still has daily headaches that can be 4/10 in pain and can last several hours and linger which are improved with taking a break. She is improving slowly but she still has problems that she is dealing with. This has caused a lot of stress, being out of work, she has had to use sick time at work, she was unable to care for her new puppy and had to find daycare for him. Her parents had to care for her, they have been at her house helping with daily chores she was not able to complete such as laundry, cooking, cleaning, driving to the grocery store. No confusion. She continues to have some sleep disturbances she never had before. She is improving but still symptoms persist. She also had neck pain, soreness, tightness and heat and ice have helped. Her back is hurting, herneck is very tight. No other associated symptoms or modifiable  factors.   Reviewed notes, labs and imaging from outside physicians, which showed:  Reviewed labs: cmp normal  Personally reviewed CT images and agree with the following:  CT HEAD FINDINGS  BRAIN: The ventricles and sulci are normal. No intraparenchymal hemorrhage, mass effect nor midline shift. No acute large vascular territory infarcts. No abnormal extra-axial fluid collections. Basal cisterns are  patent.  VASCULAR: Unremarkable.  SKULL/SOFT TISSUES: No skull fracture. Small frontal scalp hematoma without subcutaneous gas or radiopaque foreign bodies.  ORBITS/SINUSES: The included ocular globes and orbital contents are normal.The mastoid air-cells and included paranasal sinuses are well-aerated.  OTHER: None.  CT CERVICAL SPINE FINDINGS  ALIGNMENT: Vertebral bodies in alignment. Straightened lordosis.  SKULL BASE AND VERTEBRAE: Cervical vertebral bodies and posterior elements are intact. Intervertebral disc heights preserved. No destructive bony lesions. C1-2 articulation maintained.  SOFT TISSUES AND SPINAL CANAL: Normal.  DISC LEVELS: No significant osseous canal stenosis or neural foraminal narrowing.  Review of Systems: Patient complains of symptoms per HPI as well as the following symptoms: np CP, no SOB. Pertinent negatives per HPI. All others negative.   Social History   Social History  . Marital status: Single    Spouse name: N/A  . Number of children: 0  . 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 Smoking status: Never Smoker  . Smokeless tobacco: Never Used  . Alcohol use 2.4 oz/week    4 Standard drinks or equivalent per week     Comment: 3-4 drinks weekly  . Drug use: No  . Sexual activity: Yes    Partners: Male    Birth control/ protection: Pill   Other Topics Concern  . Not on file   Social History Narrative   Lives alone   Caffeine use: 1 cup daily    Family History  Problem Relation Age of Onset  . Hyperlipidemia Father   . Hypertension Father     ?  Marland Kitchen Heart disease Paternal Grandfather   . Depression Maternal Uncle   . Lung cancer Maternal Grandfather     Past Medical History:  Diagnosis Date  . Abnormal Pap smear of cervix 3/11   h/o AGUS pap  . Headache(784.0)    quite a few years ago  . 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 MRSA  infection 2009   on skin  . Right ankle injury     Past Surgical History:  Procedure Laterality Date  . COLPOSCOPY    . skin cyst excision     left axillary  . TONSILLECTOMY AND ADENOIDECTOMY N/A 01/02/2013   Procedure: TONSILLECTOMY ;  Surgeon: Jodi Marble, MD;  Location: [facility];  Service: ENT;  Laterality: N/A;  . WISDOM TOOTH EXTRACTION      Current Outpatient Prescriptions  Medication Sig Dispense Refill  . cyclobenzaprine (FLEXERIL) 10 MG tablet Take 1 tablet (10 mg total) by mouth 3 (three) times daily as needed for muscle spasms. 90 tablet 3   No current facility-administered medications for this visit.     Allergies as of 02/27/2016 - Review Complete 11/21/2015  Allergen Reaction Noted  . [name] [drospirenone-ethinyl estradiol] Rash 03/18/2014    Vitals: There were no vitals taken for this visit. Last Weight:  Wt Readings from Last 1 Encounters:  11/21/15 182 lb 3.2 oz (82.6 kg)   Last Height:   Ht Readings from Last 1 Encounters:  11/21/15 5\' 3"  (1.6 m)    Physical  exam: Exam: Gen: NAD, conversant, well nourised, well groomed                     CV: RRR, no MRG. No Carotid Bruits. No peripheral edema, warm, nontender Eyes: Conjunctivae clear without exudates or hemorrhage  Neuro: Detailed Neurologic Exam  Speech:    Speech is normal; fluent and spontaneous with normal comprehension.  Cognition:    The patient is oriented to person, place, and time;     recent and remote memory intact;     language fluent;     normal attention, concentration,     fund of knowledge Cranial Nerves:    The pupils are equal, round, and reactive to light. The fundi are normal and spontaneous venous pulsations are present. Visual fields are full to finger confrontation. Extraocular movements are intact. Trigeminal sensation is intact and the muscles of mastication are normal. The face is symmetric. The palate elevates in the midline. Hearing intact. Voice is normal.  Shoulder shrug is normal. The tongue has normal motion without fasciculations.   Coordination:    Normal finger to nose and heel to shin. Normal rapid alternating movements.   Gait:    Heel-toe and tandem gait are normal.   Motor Observation:    No asymmetry, no atrophy, and no involuntary movements noted. Tone:    Normal muscle tone.    Posture:    Posture is normal. normal erect    Strength:    Strength is V/V in the upper and lower limbs.      Sensation: intact to LT     Reflex Exam:  DTR's:    Deep tendon reflexes in the upper and lower extremities are normal bilaterally.   Toes:    The toes are downgoing bilaterally.   Clonus:    Clonus is absent.      Assessment/Plan:  26 year old with post-concussive syndrome(PCS) and neck pain due to speaker falling on head. PCS can significantly affect daily functioning. Neuro exam normal and CT of the head and neck are unremarkable but this does not exclude a diagnosis of PCS as imaging of the brain is often normal in concussion. Discussed with patient at length. Rest is important in concussion recovery. Recommend shortened work days, working from home if she can, taking frequent breaks. No strenuous activity, limiting computer and reading time. Usual recovery for first-time concussions is 3-6 months, often though symptoms will remain upwards to a year and can become chronic. Patient continues to have post-concussive headaches and I feel treatment is warranted.  As far as your medications are concerned, I would like to suggest: Topiramate start with 25mg  at night and increase to 50mg  in one week. Do not get pregnant, discussed teratogenicity. Contact me in 3 weeks and we may need to adjust dose.   Discussed the following: To prevent or relieve headaches, try the following: Cool Compress. Lie down and place a cool compress on your head.  Avoid headache triggers. If certain foods or odors seem to have triggered your migraines  in the past, avoid them. A headache diary might help you identify triggers.  Include physical activity in your daily routine. Try a daily walk or other moderate aerobic exercise.  Manage stress. Find healthy ways to cope with the stressors, such as delegating tasks on your to-do list.  Practice relaxation techniques. Try deep breathing, yoga, massage and visualization.  Eat regularly. Eating regularly scheduled meals and maintaining a healthy diet might help prevent  headaches. Also, drink plenty of fluids.  Follow a regular sleep schedule. Sleep deprivation might contribute to headaches Consider biofeedback. With this mind-body technique, you learn to control certain bodily functions - such as muscle tension, heart rate and blood pressure - to prevent headaches or reduce headache pain.    Proceed to emergency room if you experience new or worsening symptoms or symptoms do not resolve, if you have new neurologic symptoms or if headache is severe, or for any concerning symptom.     Sarina Ill, MD  [facility] [address] [place], [place] [address]  Phone [phone] Fax [phone] A total of 25 minutes was spent face-to-face with this patient. Over half this time was spent on counseling patient on the post-concussive headache diagnosis and different diagnostic and therapeutic options available.

## 2016-02-27 NOTE — Patient Instructions (Signed)
Remember to drink plenty of fluid, eat healthy meals and do not skip any meals. Try to eat protein with a every meal and eat a healthy snack such as fruit or nuts in between meals. Try to keep a regular sleep-wake schedule and try to exercise daily, particularly in the form of walking, 20-30 minutes a day, if you can.   As far as your medications are concerned, I would like to suggest: Topiramate start with 25mg  at night and increase to 50mg  in one week.  I would like to see you back as needed, sooner if we need to. Please call us with any interim questions, concerns, problems, updates or refill requests.   Our phone number is [phone]. We also have an after hours call service for urgent matters and there is a physician on-call for urgent questions. For any emergencies you know to call 911 or go to the nearest emergency room  Topiramate tablets What is this medicine? TOPIRAMATE (toe PYRE a mate) is used to treat seizures in adults or children with epilepsy. It is also used for the prevention of migraine headaches. This medicine may be used for other purposes; ask your health care provider or pharmacist if you have questions. COMMON BRAND NAME(S): Topamax, Topiragen What should I tell my health care provider before I take this medicine? They need to know if you have any of these conditions: -bleeding disorders -cirrhosis of the liver or liver disease -diarrhea -glaucoma -kidney stones or kidney disease -low blood counts, like low white cell, platelet, or red cell counts -lung disease like asthma, obstructive pulmonary disease, emphysema -metabolic acidosis -on a ketogenic diet -schedule for surgery or a procedure -suicidal thoughts, plans, or attempt; a previous suicide attempt by you or a family member -an unusual or allergic reaction to topiramate, other medicines, foods, dyes, or preservatives -pregnant or trying to get pregnant -breast-feeding How should I use this medicine? Take  this medicine by mouth with a glass of water. Follow the directions on the prescription label. Do not crush or chew. You may take this medicine with meals. Take your medicine at regular intervals. Do not take it more often than directed. Talk to your pediatrician regarding the use of this medicine in children. Special care may be needed. While this drug may be prescribed for children as young as 43 years of age for selected conditions, precautions do apply. Overdosage: If you think you have taken too much of this medicine contact a poison control center or emergency room at once. NOTE: This medicine is only for you. Do not share this medicine with others. What if I miss a dose? If you miss a dose, take it as soon as you can. If your next dose is to be taken in less than 6 hours, then do not take the missed dose. Take the next dose at your regular time. Do not take double or extra doses. What may interact with this medicine? Do not take this medicine with any of the following medications: -probenecid This medicine may also interact with the following medications: -acetazolamide -alcohol -amitriptyline -aspirin and aspirin-like medicines -birth control pills -certain medicines for depression -certain medicines for seizures -certain medicines that treat or prevent blood clots like warfarin, enoxaparin, dalteparin, apixaban, dabigatran, and rivaroxaban -digoxin -hydrochlorothiazide -lithium -medicines for pain, sleep, or muscle relaxation -metformin -methazolamide -NSAIDS, medicines for pain and inflammation, like ibuprofen or naproxen -pioglitazone -risperidone This list may not describe all possible interactions. Give your health care provider a list of  all the medicines, herbs, non-prescription drugs, or dietary supplements you use. Also tell them if you smoke, drink alcohol, or use illegal drugs. Some items may interact with your medicine. What should I watch for while using this  medicine? Visit your doctor or health care professional for regular checks on your progress. Do not stop taking this medicine suddenly. This increases the risk of seizures if you are using this medicine to control epilepsy. Wear a medical identification bracelet or chain to say you have epilepsy or seizures, and carry a card that lists all your medicines. This medicine can decrease sweating and increase your body temperature. Watch for signs of deceased sweating or fever, especially in children. Avoid extreme heat, hot baths, and saunas. Be careful about exercising, especially in hot weather. Contact your health care provider right away if you notice a fever or decrease in sweating. You should drink plenty of fluids while taking this medicine. If you have had kidney stones in the past, this will help to reduce your chances of forming kidney stones. If you have stomach pain, with nausea or vomiting and yellowing of your eyes or skin, call your doctor immediately. You may get drowsy, dizzy, or have blurred vision. Do not drive, use machinery, or do anything that needs mental alertness until you know how this medicine affects you. To reduce dizziness, do not sit or stand up quickly, especially if you are an older patient. Alcohol can increase drowsiness and dizziness. Avoid alcoholic drinks. If you notice blurred vision, eye pain, or other eye problems, seek medical attention at once for an eye exam. The use of this medicine may increase the chance of suicidal thoughts or actions. Pay special attention to how you are responding while on this medicine. Any worsening of mood, or thoughts of suicide or dying should be reported to your health care professional right away. This medicine may increase the chance of developing metabolic acidosis. If left untreated, this can cause kidney stones, bone disease, or slowed growth in children. Symptoms include breathing fast, fatigue, loss of appetite, irregular heartbeat,  or loss of consciousness. Call your doctor immediately if you experience any of these side effects. Also, tell your doctor about any surgery you plan on having while taking this medicine since this may increase your risk for metabolic acidosis. Birth control pills may not work properly while you are taking this medicine. Talk to your doctor about using an extra method of birth control. Women who become pregnant while using this medicine may enroll in the [place] Pregnancy Registry by calling [phone]. This registry collects information about the safety of antiepileptic drug use during pregnancy. What side effects may I notice from receiving this medicine? Side effects that you should report to your doctor or health care professional as soon as possible: -allergic reactions like skin rash, itching or hives, swelling of the face, lips, or tongue -decreased sweating and/or rise in body temperature -depression -difficulty breathing, fast or irregular breathing patterns -difficulty speaking -difficulty walking or controlling muscle movements -hearing impairment -redness, blistering, peeling or loosening of the skin, including inside the mouth -tingling, pain or numbness in the hands or feet -unusual bleeding or bruising -unusually weak or tired -worsening of mood, thoughts or actions of suicide or dying Side effects that usually do not require medical attention (report to your doctor or health care professional if they continue or are bothersome): -altered taste -back pain, joint or muscle aches and pains -diarrhea, or constipation -headache -loss  of appetite -nausea -stomach upset, indigestion -tremors This list may not describe all possible side effects. Call your doctor for medical advice about side effects. You may report side effects to FDA at 1-800-FDA-1088. Where should I keep my medicine? Keep out of the reach of children. Store at room temperature  between 15 and 30 degrees C (59 and 86 degrees F) in a tightly closed container. Protect from moisture. Throw away any unused medicine after the expiration date. NOTE: This sheet is a summary. It may not cover all possible information. If you have questions about this medicine, talk to your doctor, pharmacist, or health care provider.  2017 [employer] (2013-01-04 23:17:57)

## 2016-03-04 ENCOUNTER — Telehealth: Payer: Self-pay | Admitting: *Deleted

## 2016-03-04 NOTE — Telephone Encounter (Signed)
Pt needs office notes from 02/13 ready to pick up call pt back and tell if it is at front desk

## 2016-03-04 NOTE — Telephone Encounter (Signed)
Debra- can you take care of this for the patient? Thank you 

## 2016-05-23 ENCOUNTER — Ambulatory Visit: Payer: Managed Care, Other (non HMO) | Admitting: Allergy

## 2016-05-23 ENCOUNTER — Other Ambulatory Visit: Payer: Self-pay | Admitting: *Deleted

## 2016-06-19 ENCOUNTER — Other Ambulatory Visit: Payer: Self-pay | Admitting: Certified Nurse Midwife

## 2016-06-19 DIAGNOSIS — Z3041 Encounter for surveillance of contraceptive pills: Secondary | ICD-10-CM

## 2016-06-19 NOTE — Telephone Encounter (Signed)
Medication refill request: OCP  Last AEX:  05-31-15  Next AEX: 06-26-16  Last MMG (if hormonal medication request): N/A Refill authorized: please advise

## 2016-06-20 ENCOUNTER — Other Ambulatory Visit: Payer: Self-pay | Admitting: Certified Nurse Midwife

## 2016-06-20 NOTE — Telephone Encounter (Signed)
Rx sent today #[id number] /[id number] to [facility]   Patient notified.

## 2016-06-20 NOTE — Telephone Encounter (Signed)
Patient is asking for the status of refill request from yesterday. Patient is has been out of her birth control for days. Confirm pharmacy on file.

## 2016-06-26 ENCOUNTER — Other Ambulatory Visit (HOSPITAL_COMMUNITY)
Admission: RE | Admit: 2016-06-26 | Discharge: 2016-06-26 | Disposition: A | Discharge status: Home / Self Care | Payer: 59 | Source: Ambulatory Visit | Attending: Obstetrics & Gynecology | Admitting: Obstetrics & Gynecology

## 2016-06-26 ENCOUNTER — Encounter: Payer: Self-pay | Admitting: Certified Nurse Midwife

## 2016-06-26 ENCOUNTER — Ambulatory Visit (INDEPENDENT_AMBULATORY_CARE_PROVIDER_SITE_OTHER): Payer: 59 | Admitting: Certified Nurse Midwife

## 2016-06-26 VITALS — BP 110/64 | HR 64 | Resp 16 | Ht 63.25 in | Wt 184.0 lb

## 2016-06-26 DIAGNOSIS — N898 Other specified noninflammatory disorders of vagina: Secondary | ICD-10-CM

## 2016-06-26 DIAGNOSIS — Z01419 Encounter for gynecological examination (general) (routine) without abnormal findings: Secondary | ICD-10-CM | POA: Insufficient documentation

## 2016-06-26 DIAGNOSIS — Z124 Encounter for screening for malignant neoplasm of cervix: Secondary | ICD-10-CM | POA: Diagnosis not present

## 2016-06-26 DIAGNOSIS — Z30015 Encounter for initial prescription of vaginal ring hormonal contraceptive: Secondary | ICD-10-CM

## 2016-06-26 MED ORDER — ETONOGESTREL-ETHINYL ESTRADIOL 0.12-0.015 MG/24HR VA RING: 1.0000 | VAGINAL_RING | VAGINAL | 12 refills | Status: DC

## 2016-06-26 NOTE — Patient Instructions (Signed)
General topics  Next pap or exam is  due in 1 year Take a Women's multivitamin Take 1200 mg. of calcium daily - prefer dietary If any concerns in interim to call back  Breast Self-Awareness Practicing breast self-awareness may pick up problems early, prevent significant medical complications, and possibly save your life. By practicing breast self-awareness, you can become familiar with how your breasts look and feel and if your breasts are changing. This allows you to notice changes early. It can also offer you some reassurance that your breast health is good. One way to learn what is normal for your breasts and whether your breasts are changing is to do a breast self-exam. If you find a lump or something that was not present in the past, it is best to contact your caregiver right away. Other findings that should be evaluated by your caregiver include nipple discharge, especially if it is bloody; skin changes or reddening; areas where the skin seems to be pulled in (retracted); or new lumps and bumps. Breast pain is seldom associated with cancer (malignancy), but should also be evaluated by a caregiver. BREAST SELF-EXAM The best time to examine your breasts is 5 7 days after your menstrual period is over.  ExitCare Patient Information 2013 [place].   Exercise to Stay Healthy Exercise helps you become and stay healthy. EXERCISE IDEAS AND TIPS Choose exercises that:  You enjoy.  Fit into your day. You do not need to exercise really hard to be healthy. You can do exercises at a slow or medium level and stay healthy. You can:  Stretch before and after working out.  Try yoga, Pilates, or tai chi.  Lift weights.  Walk fast, swim, jog, run, climb stairs, bicycle, dance, or rollerskate.  Take aerobic classes. Exercises that burn about 150 calories:  Running 1  miles in 15 minutes.  Playing volleyball for 45 to 60 minutes.  Washing and waxing a car for 45 to 60  minutes.  Playing touch football for 45 minutes.  Walking 1  miles in 35 minutes.  Pushing a stroller 1  miles in 30 minutes.  Playing basketball for 30 minutes.  Raking leaves for 30 minutes.  Bicycling 5 miles in 30 minutes.  Walking 2 miles in 30 minutes.  Dancing for 30 minutes.  Shoveling snow for 15 minutes.  Swimming laps for 20 minutes.  Walking up stairs for 15 minutes.  Bicycling 4 miles in 15 minutes.  Gardening for 30 to 45 minutes.  Jumping rope for 15 minutes.  Washing windows or floors for 45 to 60 minutes. Document Released: 02/02/2010 Document Revised: 03/25/2011 Document Reviewed: 02/02/2010 [facility] Patient Information 2013 [place].   Other topics ( that may be useful information):    Sexually Transmitted Disease Sexually transmitted disease (STD) refers to any infection that is passed from person to person during sexual activity. This may happen by way of saliva, semen, blood, vaginal mucus, or urine. Common STDs include:  Gonorrhea.  Chlamydia.  Syphilis.  HIV/AIDS.  Genital herpes.  Hepatitis B and C.  Trichomonas.  Human papillomavirus (HPV).  Pubic lice. CAUSES  An STD may be spread by bacteria, virus, or parasite. A person can get an STD by:  Sexual intercourse with an infected person.  Sharing sex toys with an infected person.  Sharing needles with an infected person.  Having intimate contact with the genitals, mouth, or rectal areas of an infected person. SYMPTOMS  Some people  may not have any symptoms, but they can still pass the infection to others. Different STDs have different symptoms. Symptoms include:  Painful or bloody urination.  Pain in the pelvis, abdomen, vagina, anus, throat, or eyes.  Skin rash, itching, irritation, growths, or sores (lesions). These usually occur in the genital or anal area.  Abnormal vaginal discharge.  Penile discharge in men.  Soft, flesh-colored skin growths in the  genital or anal area.  Fever.  Pain or bleeding during sexual intercourse.  Swollen glands in the groin area.  Yellow skin and eyes (jaundice). This is seen with hepatitis. DIAGNOSIS  To make a diagnosis, your caregiver may:  Take a medical history.  Perform a physical exam.  Take a specimen (culture) to be examined.  Examine a sample of discharge under a microscope.  Perform blood test TREATMENT   Chlamydia, gonorrhea, trichomonas, and syphilis can be cured with antibiotic medicine.  Genital herpes, hepatitis, and HIV can be treated, but not cured, with prescribed medicines. The medicines will lessen the symptoms.  Genital warts from HPV can be treated with medicine or by freezing, burning (electrocautery), or surgery. Warts may come back.  HPV is a virus and cannot be cured with medicine or surgery.However, abnormal areas may be followed very closely by your caregiver and may be removed from the cervix, vagina, or vulva through office procedures or surgery. If your diagnosis is confirmed, your recent sexual partners need treatment. This is true even if they are symptom-free or have a negative culture or evaluation. They should not have sex until their caregiver says it is okay. HOME CARE INSTRUCTIONS  All sexual partners should be informed, tested, and treated for all STDs.  Take your antibiotics as directed. Finish them even if you start to feel better.  Only take over-the-counter or prescription medicines for pain, discomfort, or fever as directed by your caregiver.  Rest.  Eat a balanced diet and drink enough fluids to keep your urine clear or pale yellow.  Do not have sex until treatment is completed and you have followed up with your caregiver. STDs should be checked after treatment.  Keep all follow-up appointments, Pap tests, and blood tests as directed by your caregiver.  Only use latex condoms and water-soluble lubricants during sexual activity. Do not use  petroleum jelly or oils.  Avoid alcohol and illegal drugs.  Get vaccinated for HPV and hepatitis. If you have not received these vaccines in the past, talk to your caregiver about whether one or both might be right for you.  Avoid risky sex practices that can break the skin. The only way to avoid getting an STD is to avoid all sexual activity.Latex condoms and dental dams (for oral sex) will help lessen the risk of getting an STD, but will not completely eliminate the risk. SEEK MEDICAL CARE IF:   You have a fever.  You have any new or worsening symptoms. Document Released: 03/23/2002 Document Revised: 03/25/2011 Document Reviewed: 03/30/2010 [facility] Patient Information 2013 [place].    Domestic Abuse You are being battered or abused if someone close to you hits, pushes, or physically hurts you in any way. You also are being abused if you are forced into activities. You are being sexually abused if you are forced to have sexual contact of any kind. You are being emotionally abused if you are made to feel worthless or if you are constantly threatened. It is important to remember that help is available. No one has the right  ABUSE  Learn the warning signs of danger. This varies with situations but may include: the use of alcohol, threats, isolation from friends and family, or forced sexual contact. Leave if you feel that violence is going to occur.  If you are attacked or beaten, report it to the police so the abuse is documented. You do not have to press charges. The police can protect you while you or the attackers are leaving. Get the officer's name and badge number and a copy of the report.  Find someone you can trust and tell them what is happening to you: your caregiver, a nurse, clergy member, close friend or family member. Feeling ashamed is natural, but remember that you have done nothing wrong. No one deserves abuse. Document Released:  12/29/1999 Document Revised: 03/25/2011 Document Reviewed: 03/08/2010 Exit[employer] Patient Information 2013 [employer].    How Much is Too Much Alcohol? Drinking too much alcohol can cause injury, accidents, and health problems. These types of problems can include:   Car crashes.  Falls.  Family fighting (domestic violence).  Drowning.  Fights.  Injuries.  Burns.  Damage to certain organs.  Having a baby with birth defects. ONE DRINK CAN BE TOO MUCH WHEN YOU ARE:  Working.  Pregnant or breastfeeding.  Taking medicines. Ask your doctor.  Driving or planning to drive. If you or someone you know has a drinking problem, get help from a doctor.  Document Released: 10/27/2008 Document Revised: 03/25/2011 Document Reviewed: 10/27/2008 ExitCare Patient Information 2013 [employer].   Smoking Hazards Smoking cigarettes is extremely bad for your health. Tobacco smoke has over 200 known poisons in it. There are over 60 chemicals in tobacco smoke that cause cancer. Some of the chemicals found in cigarette smoke include:   Cyanide.  Benzene.  Formaldehyde.  Methanol (wood alcohol).  Acetylene (fuel used in welding torches).  Ammonia. Cigarette smoke also contains the poisonous gases nitrogen oxide and carbon monoxide.  Cigarette smokers have an increased risk of many serious medical problems and Smoking causes approximately:  90% of all lung cancer deaths in men.  80% of all lung cancer deaths in women.  90% of deaths from chronic obstructive lung disease. Compared with nonsmokers, smoking increases the risk of:  Coronary heart disease by 2 to 4 times.  Stroke by 2 to 4 times.  Men developing lung cancer by 23 times.  Women developing lung cancer by 13 times.  Dying from chronic obstructive lung diseases by 12 times.  . Smoking is the most preventable cause of death and disease in our society.  WHY IS SMOKING ADDICTIVE?  Nicotine is the chemical  agent in tobacco that is capable of causing addiction or dependence.  When you smoke and inhale, nicotine is absorbed rapidly into the bloodstream through your lungs. Nicotine absorbed through the lungs is capable of creating a powerful addiction. Both inhaled and non-inhaled nicotine may be addictive.  Addiction studies of cigarettes and spit tobacco show that addiction to nicotine occurs mainly during the teen years, when young people begin using tobacco products. WHAT ARE THE BENEFITS OF QUITTING?  There are many health benefits to quitting smoking.   Likelihood of developing cancer and heart disease decreases. Health improvements are seen almost immediately.  Blood pressure, pulse rate, and breathing patterns start returning to normal soon after quitting. QUITTING SMOKING   American Lung Association - 1-800-LUNGUSA  American Cancer Society - 1-800-ACS-2345 Document Released: 02/08/2004 Document Revised: 03/25/2011 Document Reviewed: 10/12/2008 ExitCare Patient Information 2013 [employer].   Stress Management Stress is a state of physical or mental tension that often results from changes in your life or normal routine. Some common causes of stress are:  Death of a loved one.  Injuries or severe illnesses.  Getting fired or changing jobs.  Moving into a new home. Other causes may be:  Sexual problems.  Business or financial losses.  Taking on a large debt.  Regular conflict with someone at home or at work.  Constant tiredness from lack of sleep. It is not just bad things that are stressful. It may be stressful to:  Win the lottery.  Get married.  Buy a new car. The amount of stress that can be easily tolerated varies from person to person. Changes generally cause stress, regardless of the types of change. Too much stress can affect your health. It may lead to physical or emotional problems. Too little stress (boredom) may also become stressful. SUGGESTIONS TO  REDUCE STRESS:  Talk things over with your family and friends. It often is helpful to share your concerns and worries. If you feel your problem is serious, you may want to get help from a professional counselor.  Consider your problems one at a time instead of lumping them all together. Trying to take care of everything at once may seem impossible. List all the things you need to do and then start with the most important one. Set a goal to accomplish 2 or 3 things each day. If you expect to do too many in a single day you will naturally fail, causing you to feel even more stressed.  Do not use alcohol or drugs to relieve stress. Although you may feel better for a short time, they do not remove the problems that caused the stress. They can also be habit forming.  Exercise regularly - at least 3 times per week. Physical exercise can help to relieve that "uptight" feeling and will relax you.  The shortest distance between despair and hope is often a good night's sleep.  Go to bed and get up on time allowing yourself time for appointments without being rushed.  Take a short "time-out" period from any stressful situation that occurs during the day. Close your eyes and take some deep breaths. Starting with the muscles in your face, tense them, hold it for a few seconds, then relax. Repeat this with the muscles in your neck, shoulders, hand, stomach, back and legs.  Take good care of yourself. Eat a balanced diet and get plenty of rest.  Schedule time for having fun. Take a break from your daily routine to relax. HOME CARE INSTRUCTIONS   Call if you feel overwhelmed by your problems and feel you can no longer manage them on your own.  Return immediately if you feel like hurting yourself or someone else. Document Released: 06/26/2000 Document Revised: 03/25/2011 Document Reviewed: 02/16/2007 ExitCare Patient Information 2013 [employer].  Ethinyl Estradiol; Etonogestrel vaginal ring What is  this medicine? ETHINYL ESTRADIOL; ETONOGESTREL (ETH in il es tra DYE ole; et oh noe JES trel) vaginal ring is a flexible, vaginal ring used as a contraceptive (birth control method). This medicine combines two types of female hormones, an estrogen and a progestin. This ring is used to prevent ovulation and pregnancy. Each ring is effective for one month. This medicine may be used for other purposes; ask your health care provider or pharmacist if you have questions. COMMON BRAND NAME(S): NuvaRing What should I tell my health care   provider before I take this medicine? They need to know if you have or ever had any of these conditions: -abnormal vaginal bleeding -blood vessel disease or blood clots -breast, cervical, endometrial, ovarian, liver, or uterine cancer -diabetes -gallbladder disease -heart disease or recent heart attack -high blood pressure -high cholesterol -kidney disease -liver disease -migraine headaches -stroke -systemic lupus erythematosus (SLE) -tobacco smoker -an unusual or allergic reaction to estrogens, progestins, other medicines, foods, dyes, or preservatives -pregnant or trying to get pregnant -breast-feeding How should I use this medicine? Insert the ring into your vagina as directed. Follow the directions on the prescription label. The ring will remain place for 3 weeks and is then removed for a 1-week break. A new ring is inserted 1 week after the last ring was removed, on the same day of the week. Check often to make sure the ring is still in place, especially before and after sexual intercourse. If the ring was out of the vagina for an unknown amount of time, you may not be protected from pregnancy. Perform a pregnancy test and call your doctor. Do not use more often than directed. A patient package insert for the product will be given with each prescription and refill. Read this sheet carefully each time. The sheet may change frequently. Contact your pediatrician  regarding the use of this medicine in children. Special care may be needed. This medicine has been used in female children who have started having menstrual periods. Overdosage: If you think you have taken too much of this medicine contact a poison control center or emergency room at once. NOTE: This medicine is only for you. Do not share this medicine with others. What if I miss a dose? You will need to replace your vaginal ring once a month as directed. If the ring should slip out, or if you leave it in longer or shorter than you should, contact your health care professional for advice. What may interact with this medicine? Do not take this medicine with the following medication: -dasabuvir; ombitasvir; paritaprevir; ritonavir -ombitasvir; paritaprevir; ritonavir This medicine may also interact with the following medications: -acetaminophen -antibiotics or medicines for infections, especially rifampin, rifabutin, rifapentine, and griseofulvin, and possibly penicillins or tetracyclines -aprepitant -ascorbic acid (vitamin C) -atorvastatin -barbiturate medicines, such as phenobarbital -bosentan -carbamazepine -caffeine -clofibrate -cyclosporine -dantrolene -doxercalciferol -felbamate -grapefruit juice -hydrocortisone -medicines for anxiety or sleeping problems, such as diazepam or temazepam -medicines for diabetes, including pioglitazone -modafinil -mycophenolate -nefazodone -oxcarbazepine -phenytoin -prednisolone -ritonavir or other medicines for HIV infection or AIDS -rosuvastatin -selegiline -soy isoflavones supplements -St. John's wort -tamoxifen or raloxifene -theophylline -thyroid hormones -topiramate -warfarin This list may not describe all possible interactions. Give your health care provider a list of all the medicines, herbs, non-prescription drugs, or dietary supplements you use. Also tell them if you smoke, drink alcohol, or use illegal drugs. Some items may  interact with your medicine. What should I watch for while using this medicine? Visit your doctor or health care professional for regular checks on your progress. You will need a regular breast and pelvic exam and Pap smear while on this medicine. Use an additional method of contraception during the first cycle that you use this ring. Do not use a diaphragm or female condom, as the ring can interfere with these birth control methods and their proper placement. If you have any reason to think you are pregnant, stop using this medicine right away and contact your doctor or health care professional. If you are using this   medicine for hormone related problems, it may take several cycles of use to see improvement in your condition. Smoking increases the risk of getting a blood clot or having a stroke while you are using hormonal birth control, especially if you are more than 27 years old. You are strongly advised not to smoke. This medicine can make your body retain fluid, making your fingers, hands, or ankles swell. Your blood pressure can go up. Contact your doctor or health care professional if you feel you are retaining fluid. This medicine can make you more sensitive to the sun. Keep out of the sun. If you cannot avoid being in the sun, wear protective clothing and use sunscreen. Do not use sun lamps or tanning beds/booths. If you wear contact lenses and notice visual changes, or if the lenses begin to feel uncomfortable, consult your eye care specialist. In some women, tenderness, swelling, or minor bleeding of the gums may occur. Notify your dentist if this happens. Brushing and flossing your teeth regularly may help limit this. See your dentist regularly and inform your dentist of the medicines you are taking. If you are going to have elective surgery, you may need to stop using this medicine before the surgery. Consult your health care professional for advice. This medicine does not protect you  against HIV infection (AIDS) or any other sexually transmitted diseases. What side effects may I notice from receiving this medicine? Side effects that you should report to your doctor or health care professional as soon as possible: -breast tissue changes or discharge -changes in vaginal bleeding during your period or between your periods -chest pain -coughing up blood -dizziness or fainting spells -headaches or migraines -leg, arm or groin pain -severe or sudden headaches -stomach pain (severe) -sudden shortness of breath -sudden loss of coordination, especially on one side of the body -speech problems -symptoms of vaginal infection like itching, irritation or unusual discharge -tenderness in the upper abdomen -vomiting -weakness or numbness in the arms or legs, especially on one side of the body -yellowing of the eyes or skin Side effects that usually do not require medical attention (report to your doctor or health care professional if they continue or are bothersome): -breakthrough bleeding and spotting that continues beyond the 3 initial cycles of pills -breast tenderness -mood changes, anxiety, depression, frustration, anger, or emotional outbursts -increased sensitivity to sun or ultraviolet light -nausea -skin rash, acne, or brown spots on the skin -weight gain (slight) This list may not describe all possible side effects. Call your doctor for medical advice about side effects. You may report side effects to FDA at 1-800-FDA-1088. Where should I keep my medicine? Keep out of the reach of children. Store at room temperature between 15 and 30 degrees C (59 and 86 degrees F) for up to 4 months. The product will expire after 4 months. Protect from light. Throw away any unused medicine after the expiration date. NOTE: This sheet is a summary. It may not cover all possible information. If you have questions about this medicine, talk to your doctor, pharmacist, or health care  provider.  2018 [employer]/Gold Standard (2015-09-08 17:00:31)  

## 2016-06-26 NOTE — Progress Notes (Signed)
26 y.o. G0P0000 Single  Caucasian Fe here for annual exam. Periods normal no issues. Having trouble remembering OCP. Interested in [place] use. Denies any STD concerns or STD screening needed. Some vaginal odor, but no discharge change or itching. No new personal products. No partner change. Working on weight loss again. No other health issues today.    Patient's last menstrual period was 06/10/2016 (exact date).          Sexually active: Yes.    The current method of family planning is OCP (estrogen/progesterone).    Exercising: No.  exercise Smoker:  no  Health Maintenance: Pap:  04-05-13 neg  History of Abnormal Pap: hx of AGUS & colpo 2011 MMG:  none Self Breast exams: no Colonoscopy:  none BMD:   none TDaP:  2009 Shingles: no Pneumonia: no Hep C and HIV: both neg 2016 Labs: none   reports that she has never smoked. She has never used smokeless tobacco. She reports that she drinks about 2.4 oz of alcohol per week . She reports that she does not use drugs.  Past Medical History:  Diagnosis Date  . Abnormal Pap smear of cervix 3/11   h/o AGUS pap  . Concussion 11/04/2015   speaker fell on her head  . Headache(784.0)    quite a few years ago  . 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 MRSA infection 2009   on skin  . Right ankle injury     Past Surgical History:  Procedure Laterality Date  . COLPOSCOPY  2011  . skin cyst excision     left axillary  . TONSILLECTOMY AND ADENOIDECTOMY N/A 01/02/2013   Procedure: TONSILLECTOMY ;  Surgeon: Jodi Marble, MD;  Location: [facility];  Service: ENT;  Laterality: N/A;  . WISDOM TOOTH EXTRACTION      Current Outpatient Prescriptions  Medication Sig Dispense Refill  . BLISOVI 24 FE 1-20 MG-MCG(24) tablet TAKE 1 TABLET BY MOUTH DAILY. 28 tablet 0  . topiramate (TOPAMAX) 25 MG tablet Take 2 tablets (50 mg total) by mouth 2 (two) times daily. (Patient taking differently: Take 50 mg by mouth as needed. ) 60 tablet 6   No current facility-administered  medications for this visit.     Family History  Problem Relation Age of Onset  . Hyperlipidemia Father   . Hypertension Father        ?  Marland Kitchen Heart disease Paternal Grandfather   . Depression Maternal Uncle   . Lung cancer Maternal Grandfather     ROS:  Pertinent items are noted in HPI.  Otherwise, a comprehensive ROS was negative.  Exam:   BP 110/64   Pulse 64   Resp 16   Ht 5' 3.25" (1.607 m)   Wt 184 lb (83.5 kg)   LMP 06/10/2016 (Exact Date)   BMI 32.34 kg/m  Height: 5' 3.25" (160.7 cm) Ht Readings from Last 3 Encounters:  06/26/16 5' 3.25" (1.607 m)  02/27/16 5\' 2"  (1.575 m)  11/21/15 5\' 3"  (1.6 m)    General appearance: alert, cooperative and appears stated age Head: Normocephalic, without obvious abnormality, atraumatic Neck: no adenopathy, supple, symmetrical, trachea midline and thyroid normal to inspection and palpation Lungs: clear to auscultation bilaterally Breasts: normal appearance, no masses or tenderness, No nipple retraction or dimpling, No nipple discharge or bleeding, No axillary or supraclavicular adenopathy Heart: regular rate and rhythm Abdomen: soft, non-tender; no masses,  no organomegaly Extremities: extremities normal, atraumatic, no cyanosis or edema Skin: Skin color, texture, turgor normal. No rashes  or lesions Lymph nodes: Cervical, supraclavicular, and axillary nodes normal. No abnormal inguinal nodes palpated Neurologic: Grossly normal   Pelvic: External genitalia:  no lesions              Urethra:  normal appearing urethra with no masses, tenderness or lesions              Bartholin's and Skene's: normal                 Vagina: normal appearing vagina with normal color and discharge, no lesions, Patient inserted and removed Nuvaring placebo with out difficulty. Provider checked after patient inserted. Affirm collected              Cervix: no cervical motion tenderness, no lesions and nulliparous appearance              Pap taken: Yes.    Bimanual Exam:  Uterus:  normal size, contour, position, consistency, mobility, non-tender              Adnexa: normal adnexa and no mass, fullness, tenderness               Rectovaginal: Confirms               Anus:  normal appearance  Chaperone present: yes  A:  Well Woman with normal exam  Contraception change desired Nuvaring if possible  R/O vaginal infection due to vaginal odor  History of abnormal pap smear with AGUS 2011, normal pap smears since  P:   Reviewed health and wellness pertinent to exam  Discussed risks and benefits of Nuvaring use and instructions for use. Will finish current OCP pack and start Nuvaring as she would new pill pack. Advise if any problems with use.  Rx Nuvaring see order with instructions  Will treat per affirm if indicated.   Lab :Affirm  Pap smear: yes   counseled on breast self exam, STD prevention, HIV risk factors and prevention, adequate intake of calcium and vitamin D, diet and exercise  return annually or prn  An After Visit Summary was printed and given to the patient.

## 2016-06-28 LAB — VAGINITIS/VAGINOSIS, DNA PROBE
Candida Species: NEGATIVE
Gardnerella vaginalis: NEGATIVE
TRICHOMONAS VAG: NEGATIVE

## 2016-06-28 LAB — CYTOLOGY - PAP: Diagnosis: NEGATIVE

## 2016-10-30 DIAGNOSIS — J209 Acute bronchitis, unspecified: Secondary | ICD-10-CM | POA: Diagnosis not present

## 2016-10-30 DIAGNOSIS — R509 Fever, unspecified: Secondary | ICD-10-CM | POA: Diagnosis not present

## 2016-10-30 DIAGNOSIS — Z6832 Body mass index (BMI) 32.0-32.9, adult: Secondary | ICD-10-CM | POA: Diagnosis not present

## 2016-11-13 ENCOUNTER — Telehealth: Payer: Self-pay | Admitting: Certified Nurse Midwife

## 2016-11-13 NOTE — Telephone Encounter (Signed)
Called and left patient a message to call our office back re:  Message   Appointment Request From: Rene Kocher    With Provider: Gay Filler Oriska    Preferred Date Range: From 11/13/2016 To 11/21/2016    Preferred Times: Any    Reason for visit: Office Visit    Comments:  Hi [name],  I wanted to schedule an appointment with you to discuss some issues Jenna Lane been having with mood imbalance /possible treatment options. Morning and early afternoon appointment times work best for me (anytime before 3 pm). Please let me know what you have available.   Thanks,  Charmaine Downs    Routing to triage for return call and FYI.Marland Kitchen

## 2016-11-13 NOTE — Telephone Encounter (Signed)
Patient returned call and scheduled an appointment with Melvia Heaps, CNM on 11/14/16. Routing to provider for FYI.

## 2016-11-14 ENCOUNTER — Ambulatory Visit (INDEPENDENT_AMBULATORY_CARE_PROVIDER_SITE_OTHER): Payer: 59 | Admitting: Certified Nurse Midwife

## 2016-11-14 ENCOUNTER — Encounter: Payer: Self-pay | Admitting: Certified Nurse Midwife

## 2016-11-14 VITALS — BP 108/72 | HR 72 | Resp 16 | Ht 63.25 in | Wt 187.0 lb

## 2016-11-14 DIAGNOSIS — F329 Major depressive disorder, single episode, unspecified: Secondary | ICD-10-CM | POA: Diagnosis not present

## 2016-11-14 DIAGNOSIS — F419 Anxiety disorder, unspecified: Secondary | ICD-10-CM

## 2016-11-14 DIAGNOSIS — F32A Depression, unspecified: Secondary | ICD-10-CM

## 2016-11-14 DIAGNOSIS — R4586 Emotional lability: Secondary | ICD-10-CM | POA: Diagnosis not present

## 2016-11-14 NOTE — Progress Notes (Signed)
Subjective:     Patient ID: Jenna Lane, female   DOB: April 19, 1990, 26 y.o.   MRN: [id number]  26 yo white female here to discuss her moods. Some days she is very irritable and some days just wants to stay in bed and feels depressed. Working as Museum/gallery curator with busy schedule. Feels she may not be in control with her feelings and would like to feel better. Periods do not change how she feels. She was told by friend she probably had PMD, but does not feel she symptoms with periods. Family history of depression with mother and grandparents. She does not want to go her life feeling like this and would to feel better if this depression. No thoughts of hurting self or others. No crying or panic episodes. No anger episodes. Came to discuss this and to help her if needed. Has been working on weight loss and has done well.  Feels that weight has been some of the problem attached to depression. Eats poorly when not herself. Not sure if she would take medication. No other health concerns today.     Review of Systems  Constitutional: Positive for fatigue.  HENT: Negative.   Eyes: Negative.   Respiratory: Negative.   Cardiovascular: Negative.   Gastrointestinal: Negative.   Endocrine: Negative.   Genitourinary: Negative.   Musculoskeletal: Negative.   Skin: Negative.   Allergic/Immunologic: Negative.   Neurological: Negative.   Hematological: Negative.   Psychiatric/Behavioral: Negative for hallucinations, self-injury, sleep disturbance and suicidal ideas. The patient is nervous/anxious.        No problems with insomnia sleeps an average of 8 hours nightly       Objective:   Physical Exam WDWN Affect: normal, orientation x 3     Assessment:     Anxiety/depression ? Not period related Obesity on weight loss Family history of depression Contraception Nuvaring      Plan:     Discussed with patient that depression/anxiety can occur in many ways and affect each person differently. Feel  counseling is a good first step and would like her to consider seeking counseling to identify triggers for her depression and discuss ways to handle. Discussed dietary changes can also affect moods. Given healthy diet to follow and information on period related depression for her to assess her status with next cycle. Patient agreeable. Discussed medication risks/benefits and expectations if we decide to try. Do not feel she is a candidate at this point. Patient agreeable to plan and will advise in 2 weeks of her status and progression to counseling. Information given. Stressed if thoughts of self harm or others to seek 911 or ER. RV prn  Total time with patient 38 minutes in discussion of above.

## 2016-11-14 NOTE — Patient Instructions (Signed)

## 2016-11-15 ENCOUNTER — Other Ambulatory Visit: Payer: Self-pay | Admitting: Certified Nurse Midwife

## 2016-11-15 DIAGNOSIS — E559 Vitamin D deficiency, unspecified: Secondary | ICD-10-CM

## 2016-11-15 LAB — TSH: TSH: 2.9 u[IU]/mL (ref 0.450–4.500)

## 2016-11-15 LAB — VITAMIN D 25 HYDROXY (VIT D DEFICIENCY, FRACTURES): VIT D 25 HYDROXY: 24.8 ng/mL — AB (ref 30.0–100.0)

## 2016-12-17 DIAGNOSIS — Z6833 Body mass index (BMI) 33.0-33.9, adult: Secondary | ICD-10-CM | POA: Diagnosis not present

## 2016-12-17 DIAGNOSIS — J309 Allergic rhinitis, unspecified: Secondary | ICD-10-CM | POA: Diagnosis not present

## 2016-12-17 DIAGNOSIS — Z020 Encounter for examination for admission to educational institution: Secondary | ICD-10-CM | POA: Diagnosis not present

## 2017-04-16 ENCOUNTER — Encounter: Payer: Self-pay | Admitting: Certified Nurse Midwife

## 2017-04-25 ENCOUNTER — Telehealth: Payer: Self-pay | Admitting: Neurology

## 2017-04-25 NOTE — Telephone Encounter (Signed)
Message   ----- Message from [place], Generic sent at 04/24/2017 4:47 PM EDT -----    Appointment Request From: Jenna Lane    With Provider: Melvenia Beam, MD [[facility]]    Preferred Date Range: 04/28/2017 - 05/02/2017    Preferred Times: Any time    Reason for visit: Request an Appointment    Comments:  Consistent/reoccurring frontal headaches, almost daily, with minimal relief from OTC medication as well as Topiramate PRN. Unsure whether a visit is necessary or not but would like to revisit other treatment options as this is affecting daily living.

## 2017-04-28 DIAGNOSIS — Z0100 Encounter for examination of eyes and vision without abnormal findings: Secondary | ICD-10-CM | POA: Diagnosis not present

## 2017-04-28 NOTE — Telephone Encounter (Addendum)
Spoke with Dr. Jaynee Eagles, pt will need to be seen in the office. Can see Eleasha NP.  Called pt & LVM (ok per DPR) asking for call back regarding getting an appt scheduled. Left office number in message.

## 2017-04-29 NOTE — Telephone Encounter (Signed)
Called pt & LVM (ok per DPR) asking for call back regarding getting an appt scheduled with the NP. Left office number in message.

## 2017-04-30 NOTE — Telephone Encounter (Addendum)
Called pt once more to offer appt. LVM asking for call back. Left office number in message.

## 2017-06-19 DIAGNOSIS — S339XXA Sprain of unspecified parts of lumbar spine and pelvis, initial encounter: Secondary | ICD-10-CM | POA: Diagnosis not present

## 2017-06-19 DIAGNOSIS — M545 Low back pain: Secondary | ICD-10-CM | POA: Diagnosis not present

## 2017-08-06 ENCOUNTER — Ambulatory Visit (INDEPENDENT_AMBULATORY_CARE_PROVIDER_SITE_OTHER): Payer: 59 | Admitting: Certified Nurse Midwife

## 2017-08-06 ENCOUNTER — Encounter: Payer: Self-pay | Admitting: Certified Nurse Midwife

## 2017-08-06 ENCOUNTER — Other Ambulatory Visit: Payer: Self-pay

## 2017-08-06 VITALS — BP 102/68 | HR 64 | Resp 16 | Ht 63.75 in | Wt 203.0 lb

## 2017-08-06 DIAGNOSIS — E559 Vitamin D deficiency, unspecified: Secondary | ICD-10-CM | POA: Diagnosis not present

## 2017-08-06 DIAGNOSIS — E663 Overweight: Secondary | ICD-10-CM

## 2017-08-06 DIAGNOSIS — Z01419 Encounter for gynecological examination (general) (routine) without abnormal findings: Secondary | ICD-10-CM

## 2017-08-06 DIAGNOSIS — Z3044 Encounter for surveillance of vaginal ring hormonal contraceptive device: Secondary | ICD-10-CM

## 2017-08-06 DIAGNOSIS — Z23 Encounter for immunization: Secondary | ICD-10-CM

## 2017-08-06 DIAGNOSIS — R5383 Other fatigue: Secondary | ICD-10-CM

## 2017-08-06 DIAGNOSIS — G479 Sleep disorder, unspecified: Secondary | ICD-10-CM | POA: Diagnosis not present

## 2017-08-06 MED ORDER — ETONOGESTREL-ETHINYL ESTRADIOL 0.12-0.015 MG/24HR VA RING: 1.0000 | VAGINAL_RING | VAGINAL | 12 refills | Status: DC

## 2017-08-06 NOTE — Progress Notes (Signed)
27 y.o. G0P0000 Single  Caucasian Fe here for annual exam. Periods normal, no issues. [place] working well. No warning signs with use. Working on [employer] with [employer] watchers and exercise class. Complaining of never feeling like she sleeps enough. Goes to sleep as soon as she arrives home from work, in car and can be talking and this happens. Continues to occur over the past few years and is worried about safety issues. Would like to have assessed. Strong family history of heart issues, patient has not had any issues. No other health concerns today.   Patient's last menstrual period was 07/11/2017 (exact date).          Sexually active: Yes.    The current method of family planning is NuvaRing vaginal inserts.    Exercising: Yes.    group exercise Smoker:  no  Health Maintenance: Pap:  04-05-13 neg, 06-26-16 neg History of Abnormal Pap: yes MMG:  none Self Breast exams: occ Colonoscopy:  none BMD:   none TDaP:  2009 up date today Shingles: no Pneumonia: no Hep C and HIV: both neg 2016 Labs: if needed   reports that she has never smoked. She has never used smokeless tobacco. She reports that she drinks about 1.2 oz of alcohol per week. She reports that she does not use drugs.  Past Medical History:  Diagnosis Date  . Abnormal Pap smear of cervix 3/11   h/o AGUS pap  . Concussion 11/04/2015   speaker fell on her head  . Headache(784.0)    quite a few years ago  . 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 MRSA infection 2009   on skin  . Right ankle injury     Past Surgical History:  Procedure Laterality Date  . COLPOSCOPY  2011  . skin cyst excision     left axillary  . TONSILLECTOMY AND ADENOIDECTOMY N/A 01/02/2013   Procedure: TONSILLECTOMY ;  Surgeon: Jodi Marble, MD;  Location: [facility];  Service: ENT;  Laterality: N/A;  . WISDOM TOOTH EXTRACTION      Current Outpatient Medications  Medication Sig Dispense Refill  . etonogestrel-ethinyl estradiol (NUVARING) 0.12-0.015 MG/24HR vaginal  ring Place 1 each vaginally every 28 (twenty-eight) days. Insert vaginally and leave in place for 3 consecutive weeks, then remove for 1 week. 1 each 12  . ibuprofen (ADVIL,MOTRIN) 800 MG tablet Take 800 mg by mouth 3 (three) times daily as needed.  0  . VENTOLIN HFA 108 (90 Base) MCG/ACT inhaler   1   No current facility-administered medications for this visit.     Family History  Problem Relation Age of Onset  . Hyperlipidemia Father   . Hypertension Father        ?  Marland Kitchen Heart disease Paternal Grandfather   . Depression Maternal Uncle   . Lung cancer Maternal Grandfather     ROS:  Pertinent items are noted in HPI.  Otherwise, a comprehensive ROS was negative.  Exam:   BP 102/68   Pulse 64   Resp 16   Ht 5' 3.75" (1.619 m)   Wt 203 lb (92.1 kg)   LMP 07/11/2017 (Exact Date)   BMI 35.12 kg/m  Height: 5' 3.75" (161.9 cm) Ht Readings from Last 3 Encounters:  08/06/17 5' 3.75" (1.619 m)  11/14/16 5' 3.25" (1.607 m)  06/26/16 5' 3.25" (1.607 m)    General appearance: alert, cooperative and appears stated age Head: Normocephalic, without obvious abnormality, atraumatic Neck: no adenopathy, supple, symmetrical, trachea midline and thyroid normal to inspection  and palpation Lungs: clear to auscultation bilaterally Breasts: normal appearance, no masses or tenderness, No nipple retraction or dimpling, No nipple discharge or bleeding, No axillary or supraclavicular adenopathy Heart: regular rate and rhythm Abdomen: soft, non-tender; no masses,  no organomegaly Extremities: extremities normal, atraumatic, no cyanosis or edema Skin: Skin color, texture, turgor normal. No rashes or lesions Lymph nodes: Cervical, supraclavicular, and axillary nodes normal. No abnormal inguinal nodes palpated Neurologic: Grossly normal   Pelvic: External genitalia:  no lesions              Urethra:  normal appearing urethra with no masses, tenderness or lesions              Bartholin's and Skene's:  normal                 Vagina: normal appearing vagina with normal color and discharge, no lesions              Cervix: no cervical motion tenderness, no lesions and nulliparous appearance              Pap taken: No. Bimanual Exam:  Uterus:  normal size, contour, position, consistency, mobility, non-tender and anteverted              Adnexa: normal adnexa and no mass, fullness, tenderness               Rectovaginal: Confirms               Anus:  normal appearance  Chaperone present: yes  A:  Well Woman with normal exam  Contraception Nuvaring desired  Overweight on weight watchers for weight loss  Fatigue that still is occurring, ? Sleep issues  History of Vitamin D deficiency  Immunization update  P:   Reviewed health and wellness pertinent to exam  Risks/benefits/warning signs/side effects reviewed, desires Nuvaring  Rx Nuvaring see order with instructions  Continue weight loss journey for better health  Discussed evaluation at Sleep Clinic or specialist, patient would like referral, is very worried about this occurring. Will refer and patient will be contacted regarding appointment  Requests TDAP  FSF:[id number]  D, Lipid panel, CBC  Pap smear: no   counseled on breast self exam, STD prevention, HIV risk factors and prevention, feminine hygiene, adequate intake of calcium and vitamin D, diet and exercise  return annually or prn  An After Visit Summary was printed and given to the patient.

## 2017-08-07 LAB — LIPID PANEL
CHOL/HDL RATIO: 2.8 ratio (ref 0.0–4.4)
Cholesterol, Total: 193 mg/dL (ref 100–199)
HDL: 68 mg/dL (ref >39)
LDL CALC: 106 mg/dL — AB (ref 0–99)
TRIGLYCERIDES: 93 mg/dL (ref 0–149)
VLDL Cholesterol Cal: 19 mg/dL (ref 5–40)

## 2017-08-07 LAB — CBC
HEMATOCRIT: 43.8 % (ref 34.0–46.6)
HEMOGLOBIN: 14.1 g/dL (ref 11.1–15.9)
MCH: 28.8 pg (ref 26.6–33.0)
MCHC: 32.2 g/dL (ref 31.5–35.7)
MCV: 90 fL (ref 79–97)
Platelets: 254 10*3/uL (ref 150–450)
RBC: 4.89 x10E6/uL (ref 3.77–5.28)
RDW: 13.6 % (ref 12.3–15.4)
WBC: 8.1 10*3/uL (ref 3.4–10.8)

## 2017-08-07 LAB — VITAMIN D 25 HYDROXY (VIT D DEFICIENCY, FRACTURES): VIT D 25 HYDROXY: 17.2 ng/mL — AB (ref 30.0–100.0)

## 2017-08-08 ENCOUNTER — Other Ambulatory Visit: Payer: Self-pay

## 2017-08-08 DIAGNOSIS — E559 Vitamin D deficiency, unspecified: Secondary | ICD-10-CM

## 2017-08-08 MED ORDER — VITAMIN D (ERGOCALCIFEROL) 1.25 MG (50000 UNIT) PO CAPS: 50000.0000 [IU] | ORAL_CAPSULE | ORAL | 0 refills | Status: DC

## 2017-08-08 NOTE — Telephone Encounter (Signed)
Mailbox full. Try again. 

## 2017-08-08 NOTE — Telephone Encounter (Signed)
No voicemail try again.

## 2017-08-13 ENCOUNTER — Other Ambulatory Visit: Payer: Self-pay | Admitting: Certified Nurse Midwife

## 2017-08-13 DIAGNOSIS — G479 Sleep disorder, unspecified: Secondary | ICD-10-CM

## 2017-09-16 ENCOUNTER — Telehealth: Payer: Self-pay | Admitting: Certified Nurse Midwife

## 2017-09-16 NOTE — Telephone Encounter (Signed)
Call placed to patient in reference to a referral. °

## 2017-09-24 NOTE — Telephone Encounter (Signed)
Call placed in reference to a referral to [facility].

## 2017-11-09 IMAGING — CT CT HEAD W/O CM
3 of 8 series · 14 of 47 positions shown, 17 images · non-contrast
Comparison: MRI head May 29, 2005

CLINICAL DATA: Speaker fell on top of head from ceiling. Loss of
consciousness.

EXAM:
CT HEAD WITHOUT CONTRAST
CT CERVICAL SPINE WITHOUT CONTRAST
TECHNIQUE: Multidetector CT imaging of the head and cervical spine was
performed following the standard protocol without intravenous
contrast. Multiplanar CT image reconstructions of the cervical spine
were also generated.

[Series 9: coronal · coronal · 0.29mm/px · 3 of 63 slices shown]
[im 24/63  brain]
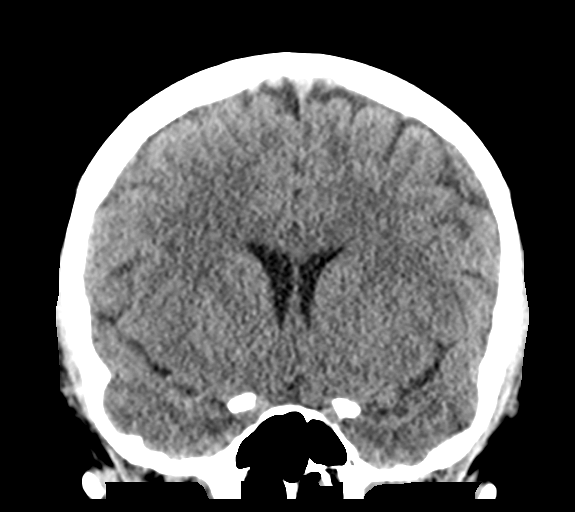
[im 32/63  brain]
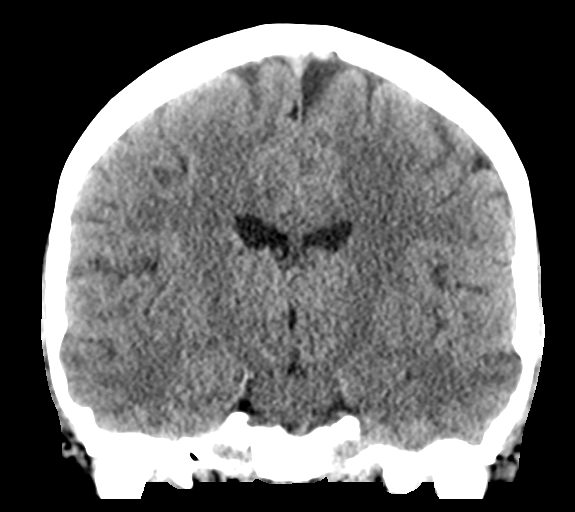
[im 39/63  brain]
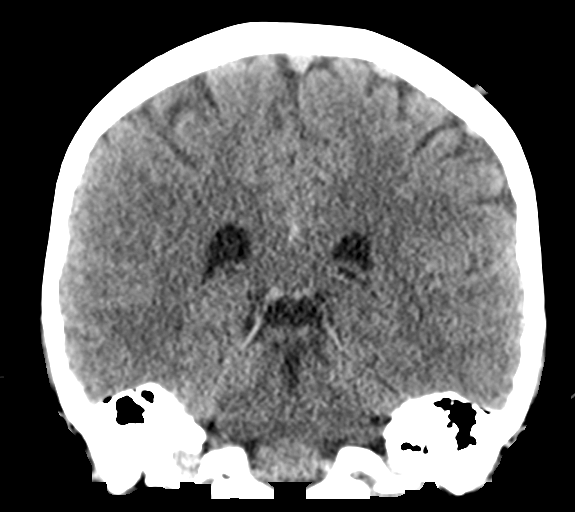

[Series 10: sagittal · sagittal · 0.30mm/px · 2 of 50 slices shown]
[im 17/50  brain]
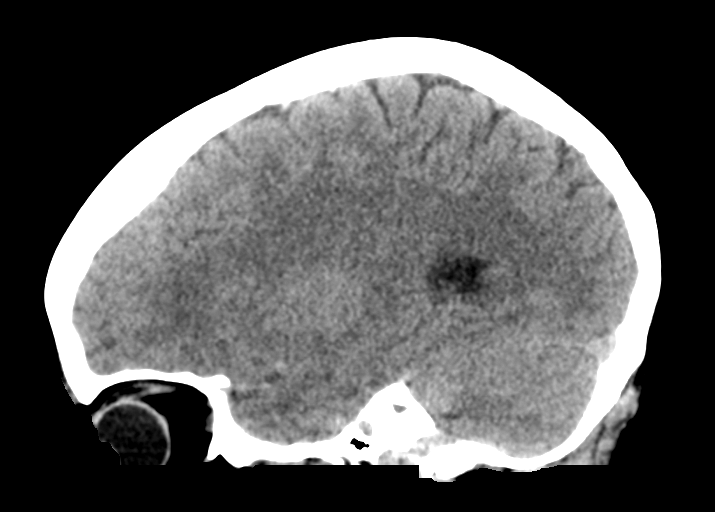
[im 33/50  brain]
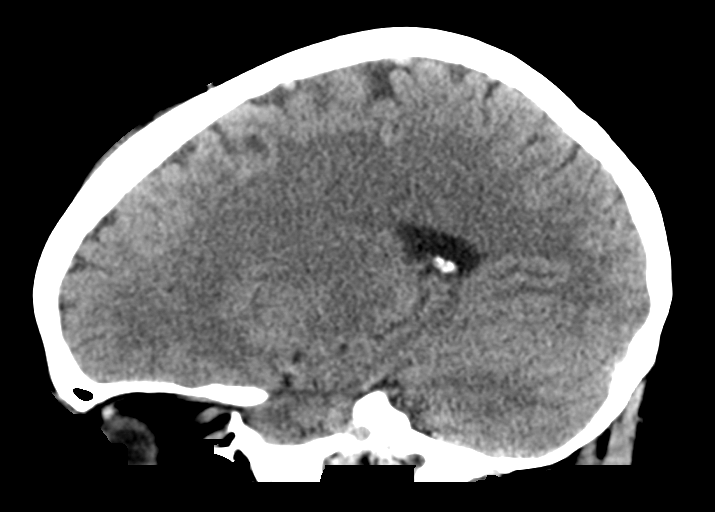

[Series 11: axial recon · axial · 0.23mm/px · z∈[-329,-212]mm · 9 of 92 slices shown, 12 images]
[im 10/92  brain]
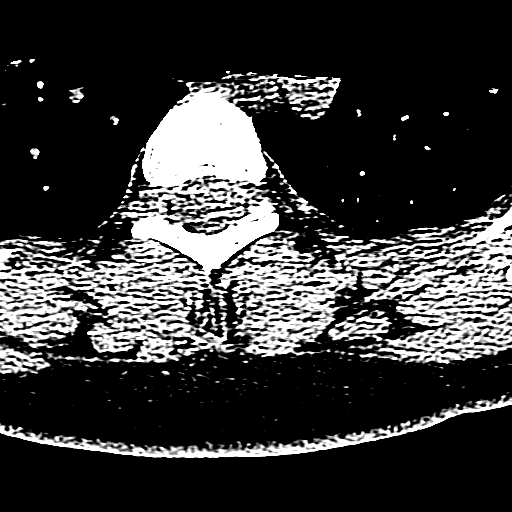
[im 10/92  bone]
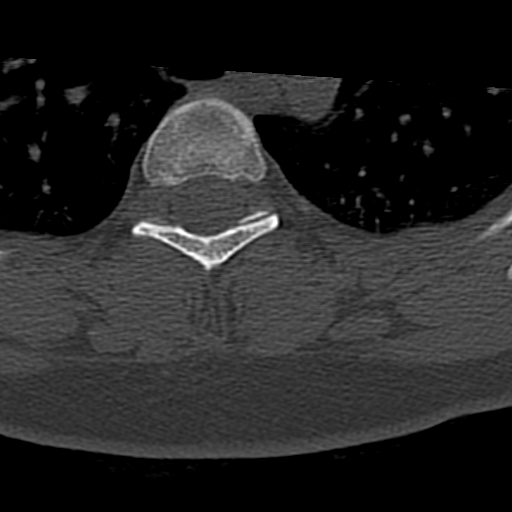
[im 19/92  brain]
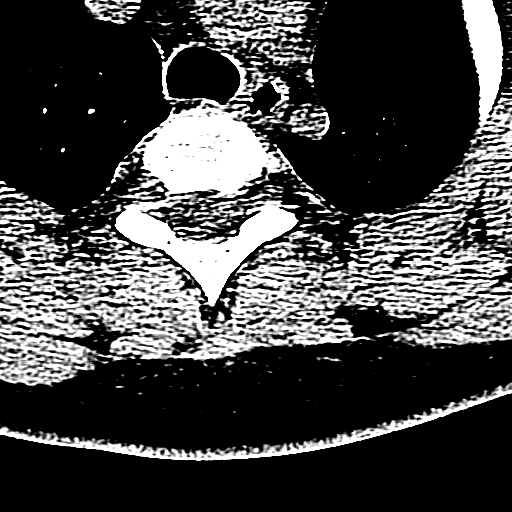
[im 28/92  brain]
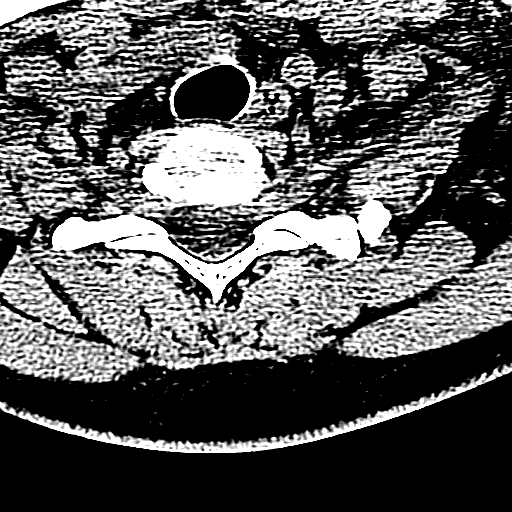
[im 37/92  brain]
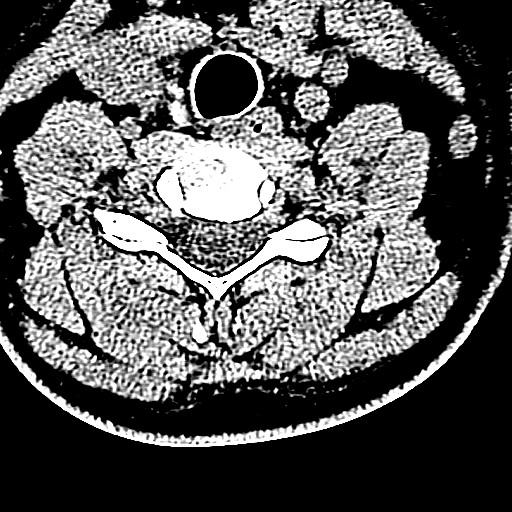
[im 46/92  brain]
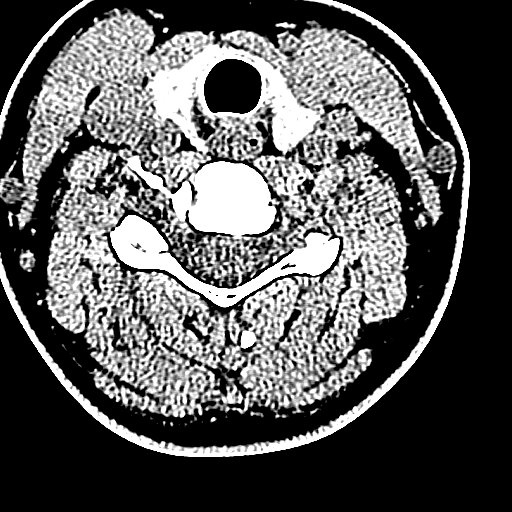
[im 46/92  bone]
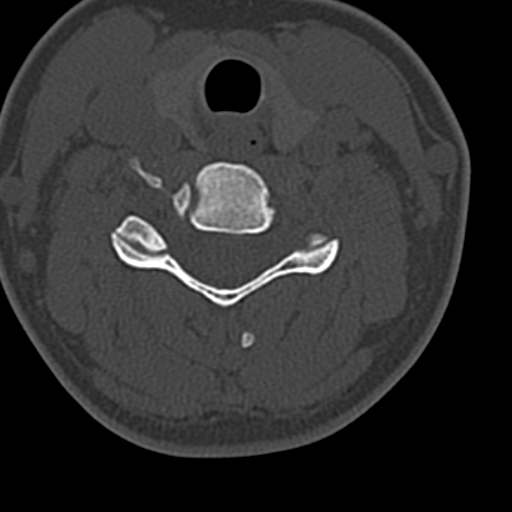
[im 55/92  brain]
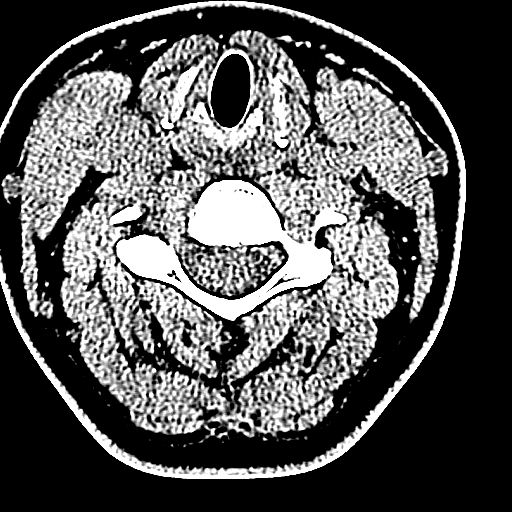
[im 64/92  brain]
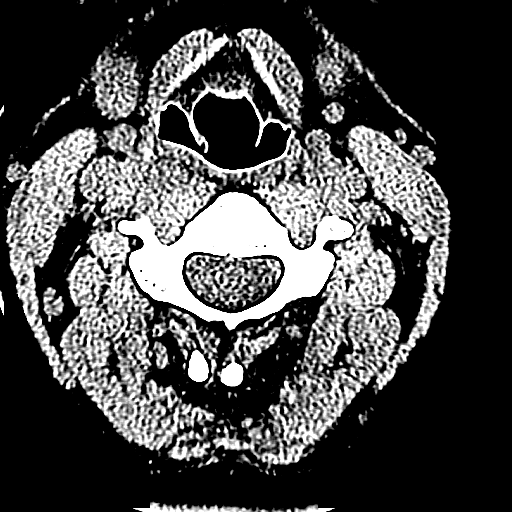
[im 73/92  brain]
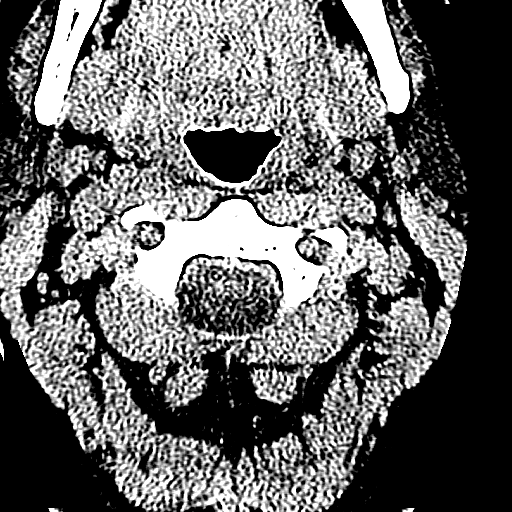
[im 82/92  brain]
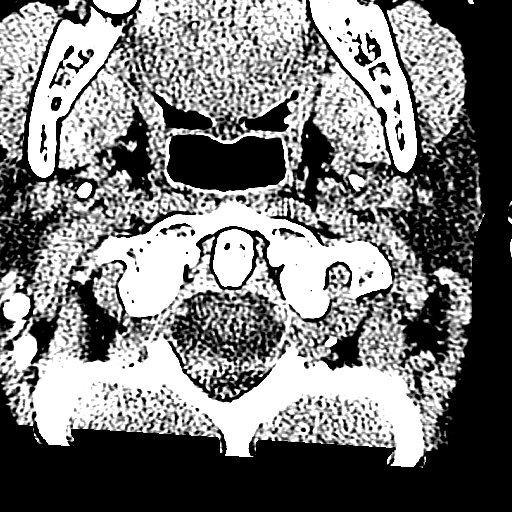
[im 82/92  bone]
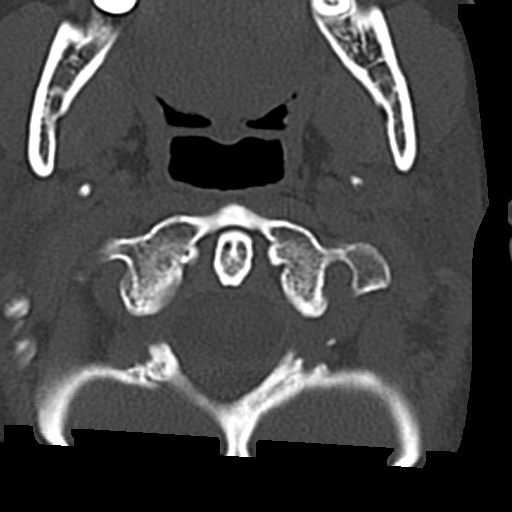

[14 of 47 positions shown; findings below may reference images not displayed]

FINDINGS: CT HEAD FINDINGS

BRAIN: The ventricles and sulci are normal. No intraparenchymal
hemorrhage, mass effect nor midline shift. No acute large vascular
territory infarcts. No abnormal extra-axial fluid collections. Basal
cisterns are patent.

VASCULAR: Unremarkable.

SKULL/SOFT TISSUES: No skull fracture. Small frontal scalp hematoma
without subcutaneous gas or radiopaque foreign bodies.

ORBITS/SINUSES: The included ocular globes and orbital contents are
normal.The mastoid air-cells and included paranasal sinuses are
well-aerated.

OTHER: None.

CT CERVICAL SPINE FINDINGS

ALIGNMENT: Vertebral bodies in alignment.  Straightened lordosis.

SKULL BASE AND VERTEBRAE: Cervical vertebral bodies and posterior
elements are intact. Intervertebral disc heights preserved. No
destructive bony lesions. C1-2 articulation maintained.

SOFT TISSUES AND SPINAL CANAL: Normal.

DISC LEVELS: No significant osseous canal stenosis or neural
foraminal narrowing.

UPPER CHEST: Lung apices are clear.

OTHER: None.
IMPRESSION: CT HEAD: Small frontal scalp hematoma. No skull fracture. Otherwise
normal CT HEAD.

CT CERVICAL SPINE: Normal.

## 2018-04-19 ENCOUNTER — Telehealth: Payer: 59 | Admitting: Family

## 2018-04-19 DIAGNOSIS — J301 Allergic rhinitis due to pollen: Secondary | ICD-10-CM | POA: Diagnosis not present

## 2018-04-19 NOTE — Progress Notes (Signed)
E visit for Allergic Rhinitis We are sorry that you are not feeling well.  Here is how we plan to help!  Based on what you have shared with me it looks like you have Allergic Rhinitis.  Rhinitis is when a reaction occurs that causes nasal congestion, runny nose, sneezing, and itching.  Most types of rhinitis are caused by an inflammation and are associated with symptoms in the eyes ears or throat. There are several types of rhinitis.  The most common are acute rhinitis, which is usually caused by a viral illness, allergic or seasonal rhinitis, and nonallergic or year-round rhinitis.  Nasal allergies occur certain times of the year.  Allergic rhinitis is caused when allergens in the air trigger the release of histamine in the body.  Histamine causes itching, swelling, and fluid to build up in the fragile linings of the nasal passages, sinuses and eyelids.  An itchy nose and clear discharge are common.  I recommend the following over the counter treatments: You should take a daily dose of antihistamine and Xyzal 5 mg take 1 tablet daily  I also would recommend a nasal spray: Flonase 2 sprays into each nostril once daily  You may also benefit from eye drops such as: Visine 1-2 drops each eye twice daily as needed   Approximately 5 minutes was spent documenting and reviewing patient's chart.    HOME CARE:   You can use an over-the-counter saline nasal spray as needed  Avoid areas where there is heavy dust, mites, or molds  Stay indoors on windy days during the pollen season  Keep windows closed in home, at least in bedroom; use air conditioner.  Use high-efficiency house air filter  Keep windows closed in car, turn AC on re-circulate  Avoid playing out with dog during pollen season  GET HELP RIGHT AWAY IF:   If your symptoms do not improve within 10 days  You become short of breath  You develop yellow or [place] discharge from your nose for over 3 days  You have coughing  fits  MAKE SURE YOU:   Understand these instructions  Will watch your condition  Will get help right away if you are not doing well or get worse  Thank you for choosing an e-visit. Your e-visit answers were reviewed by a board certified advanced clinical practitioner to complete your personal care plan. Depending upon the condition, your plan could have included both over the counter or prescription medications. Please review your pharmacy choice. Be sure that the pharmacy you have chosen is open so that you can pick up your prescription now.  If there is a problem you may message your provider in [place] to have the prescription routed to another pharmacy. Your safety is important to [place]. If you have drug allergies check your prescription carefully.  For the next 24 hours, you can use MyChart to ask questions about today's visit, request a non-urgent call back, or ask for a work or school excuse from your e-visit provider. You will get an email in the next two days asking about your experience. I hope that your e-visit has been valuable and will speed your recovery.

## 2018-05-06 ENCOUNTER — Telehealth: Payer: 59 | Admitting: Family

## 2018-05-06 DIAGNOSIS — B9689 Other specified bacterial agents as the cause of diseases classified elsewhere: Secondary | ICD-10-CM

## 2018-05-06 DIAGNOSIS — N760 Acute vaginitis: Secondary | ICD-10-CM | POA: Diagnosis not present

## 2018-05-06 MED ORDER — METRONIDAZOLE 500 MG PO TABS: 500.0000 mg | ORAL_TABLET | Freq: Two times a day (BID) | ORAL | 0 refills | Status: DC

## 2018-05-06 NOTE — Progress Notes (Signed)

## 2018-07-08 ENCOUNTER — Telehealth: Payer: Self-pay

## 2018-07-08 NOTE — Telephone Encounter (Signed)
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 Fever >100.64F []   Yes [x]   No []   Unknown Subjective fever (felt feverish) []   Yes [x]   No []   Unknown Chills []   Yes [x]   No []   Unknown Muscle aches (myalgia) []   Yes [x]   No []   Unknown Runny nose (rhinorrhea) []   Yes [x]   No []   Unknown Sore throat []   Yes [x]   No []   Unknown Cough (new onset or worsening of chronic cough) []   Yes [x]   No []   Unknown Shortness of breath (dyspnea) []   Yes []   No []   Unknown Nausea or vomiting []   Yes [x]   No []   Unknown Headache []   Yes [x]   No []   Unknown Abdominal pain  []   Yes [x]   No []   Unknown Diarrhea (?3 loose/looser than normal stools/24hr period) []   Yes [x]   No []   Unknown Other, specify:  Patient risk factors: Smoker? []   Current []   Former []   Never If female, currently pregnant? []   Yes []   No  Patient Active Problem List   Diagnosis Date Noted  . Post-concussion headache 02/27/2016  . Postconcussion syndrome 11/22/2015    Plan:  []   High risk for COVID-19 with red flags go to ED (with CP, SOB, weak/lightheaded, or fever > 101.5). Call ahead.  []   High risk for COVID-19 but stable. Inform provider and coordinate time for [facility] visit.   []   No red flags but URI signs or symptoms okay for [facility] visit.

## 2018-07-09 ENCOUNTER — Ambulatory Visit (INDEPENDENT_AMBULATORY_CARE_PROVIDER_SITE_OTHER): Payer: 59 | Admitting: Family Medicine

## 2018-07-09 ENCOUNTER — Encounter: Payer: Self-pay | Admitting: Family Medicine

## 2018-07-09 VITALS — BP 128/82 | HR 79 | Temp 98.4°F | Ht 63.0 in | Wt 204.2 lb

## 2018-07-09 DIAGNOSIS — Z7689 Persons encountering health services in other specified circumstances: Secondary | ICD-10-CM | POA: Diagnosis not present

## 2018-07-09 DIAGNOSIS — Z0000 Encounter for general adult medical examination without abnormal findings: Secondary | ICD-10-CM

## 2018-07-09 DIAGNOSIS — K580 Irritable bowel syndrome with diarrhea: Secondary | ICD-10-CM | POA: Diagnosis not present

## 2018-07-09 MED ORDER — AMITRIPTYLINE HCL 25 MG PO TABS: 25.0000 mg | ORAL_TABLET | Freq: Every day | ORAL | 0 refills | Status: DC

## 2018-07-09 NOTE — Patient Instructions (Signed)

## 2018-07-09 NOTE — Progress Notes (Signed)
Jenna Lane is a 28 y.o. female  Chief Complaint  Patient presents with  . Establish Care    est care/CPE/ ate breakfast     HPI: Jenna Lane is a 28 y.o. female here to establish care with our office and for CPE. She is not fasting. Previous PCP was Dr. Virgina Jock. She works as an Geologist, engineering for [employer].   Last PAP: 06/26/2016 - negative; follows with GYN  Diet/Exercise: pt is doing Weight Watchers and pt was going to gym 3-4x/wk pre-covid pandemic  Med refills needed today? no  She notes a h/o diarrhea mostly associated with stress/anxiety. She feels she has IBS. She also notes diarrhea with certain foods - red meat especially. She has symptoms maybe 1-2 days per week usually associated with stress. No fever, chills. No blood in stool. No weight loss. Some mild cramping just prior to BM.  Past Medical History:  Diagnosis Date  . Abnormal Pap smear of cervix 3/11   h/o AGUS pap  . Concussion 11/04/2015   speaker fell on her head  . Headache(784.0)    quite a few years ago  . 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 MRSA infection 2009   on skin  . Right ankle injury     Past Surgical History:  Procedure Laterality Date  . COLPOSCOPY  2011  . skin cyst excision     left axillary  . TONSILLECTOMY AND ADENOIDECTOMY N/A 01/02/2013   Procedure: TONSILLECTOMY ;  Surgeon: Jodi Marble, MD;  Location: [facility];  Service: ENT;  Laterality: N/A;  . WISDOM TOOTH EXTRACTION      Social History   Socioeconomic History  . Marital status: Single    Spouse name: Not on file  . Number of children: 0  . Years of education: Associates  . Highest education level: Not on file  Occupational History  . Occupation: Biochemist, clinical- Garment/textile technologist  Social Needs  . Financial resource strain: Not on file  . Food insecurity    Worry: Not on file    Inability: Not on file  . Transportation needs    Medical: Not on file    Non-medical: Not on file  Tobacco Use  . Smoking status: Never Smoker  .  Smokeless tobacco: Never Used  Substance and Sexual Activity  . Alcohol use: Yes    Alcohol/week: 2.0 standard drinks    Types: 2 Standard drinks or equivalent per week  . Drug use: No  . Sexual activity: Yes    Partners: Male    Birth control/protection: Inserts    Comment: nuvaring  Lifestyle  . Physical activity    Days per week: Not on file    Minutes per session: Not on file  . Stress: Not on file  Relationships  . Social Herbalist on phone: Not on file    Gets together: Not on file    Attends religious service: Not on file    Active member of club or organization: Not on file    Attends meetings of clubs or organizations: Not on file    Relationship status: Not on file  . Intimate partner violence    Fear of current or ex partner: Not on file    Emotionally abused: Not on file    Physically abused: Not on file    Forced sexual activity: Not on file  Other Topics Concern  . Not on file  Social History Narrative   Lives alone  Caffeine use: 1 cup daily   Right-handed    Family History  Problem Relation Age of Onset  . Hyperlipidemia Father   . Hypertension Father        ?  Marland Kitchen Heart disease Paternal Grandfather   . Depression Maternal Uncle   . Lung cancer Maternal Grandfather      Immunization History  Administered Date(s) Administered  . Tdap 01/15/2007, 08/06/2017    Outpatient Encounter Medications as of 07/09/2018  Medication Sig  . etonogestrel-ethinyl estradiol (NUVARING) 0.12-0.015 MG/24HR vaginal ring Place 1 each vaginally every 28 (twenty-eight) days. Insert vaginally and leave in place for 3 consecutive weeks, then remove for 1 week.  Marland Kitchen ibuprofen (ADVIL,MOTRIN) 800 MG tablet Take 800 mg by mouth 3 (three) times daily as needed.  . metroNIDAZOLE (FLAGYL) 500 MG tablet Take 1 tablet (500 mg total) by mouth 2 (two) times daily. (Patient not taking: Reported on 07/09/2018)  . VENTOLIN HFA 108 (90 Base) MCG/ACT inhaler   . Vitamin D,  Ergocalciferol, (DRISDOL) 50000 units CAPS capsule Take 1 capsule (50,000 Units total) by mouth every 7 (seven) days. (Patient not taking: Reported on 07/09/2018)   No facility-administered encounter medications on file as of 07/09/2018.      ROS: Gen: no fever, chills  Skin: no rash, itching ENT: no ear pain, ear drainage, nasal congestion, rhinorrhea, sinus pressure, sore throat Eyes: no blurry vision, double vision Resp: no cough, wheeze,SOB Breast: no breast tenderness, no nipple discharge, no breast masses CV: no CP, palpitations, LE edema,  GI: as above in HPI; no n/v GU: no dysuria, urgency, frequency, hematuria; no vaginal itching, odor, discharge MSK: no joint pain, myalgias, back pain Neuro: no dizziness, headache, weakness Psych: no depression, insomnia; some mild anxiety   Allergies  Allergen Reactions  . [name] [Drospirenone-Ethinyl Estradiol] Rash    BP 128/82   Pulse 79   Temp 98.4 F (36.9 C) (Oral)   Ht 5\' 3"  (1.6 m)   Wt 204 lb 3.2 oz (92.6 kg)   SpO2 98%   BMI 36.17 kg/m   Physical Exam  Constitutional: She is oriented to person, place, and time. She appears well-developed and well-nourished. No distress.  HENT:  Head: Normocephalic and atraumatic.  Right Ear: Tympanic membrane and ear canal normal.  Left Ear: Tympanic membrane and ear canal normal.  Nose: Nose normal.  Mouth/Throat: Oropharynx is clear and moist and mucous membranes are normal.  Eyes: Pupils are equal, round, and reactive to light. Conjunctivae are normal.  Neck: Neck supple. No thyromegaly present.  Cardiovascular: Normal rate, regular rhythm, normal heart sounds and intact distal pulses.  No murmur heard. Pulmonary/Chest: Effort normal and breath sounds normal. No respiratory distress. She has no wheezes. She has no rhonchi.  Abdominal: Soft. Bowel sounds are normal. She exhibits no distension and no mass. There is no abdominal tenderness.  Musculoskeletal:        General: No  edema.  Lymphadenopathy:    She has no cervical adenopathy.  Neurological: She is alert and oriented to person, place, and time. She exhibits normal muscle tone. Coordination normal.  Skin: Skin is warm and dry.  Psychiatric: She has a normal mood and affect. Her behavior is normal.     A/P:  1. Encounter to establish care with new doctor  2. Annual physical exam - UTD on PAP and follows with GYN - cont with Weight Watchers and resume regular/consistent CV exercise - immunizations UTD - ALT - AST - Basic metabolic panel -  Lipid panel - VITAMIN D 25 Hydroxy (Vit-D Deficiency, Fractures) - next CPE in 1 year  3. Irritable bowel syndrome with diarrhea - symptoms x years - no red flag symptoms Rx: - amitriptyline (ELAVIL) 25 MG tablet; Take 1 tablet (25 mg total) by mouth at bedtime.  Dispense: 90 tablet; Refill: 0 - f/u in 3-4 wks or sooner PRN Discussed plan and reviewed medications with patient, including risks, benefits, and potential side effects. Pt expressed understand. All questions answered.

## 2018-07-10 ENCOUNTER — Other Ambulatory Visit: Payer: Self-pay | Admitting: Family Medicine

## 2018-07-10 ENCOUNTER — Encounter: Payer: Self-pay | Admitting: Family Medicine

## 2018-07-10 DIAGNOSIS — E559 Vitamin D deficiency, unspecified: Secondary | ICD-10-CM

## 2018-07-10 LAB — BASIC METABOLIC PANEL
BUN/Creatinine Ratio: 15 (ref 9–23)
BUN: 11 mg/dL (ref 6–20)
CO2: 21 mmol/L (ref 20–29)
Calcium: 9.6 mg/dL (ref 8.7–10.2)
Chloride: 106 mmol/L (ref 96–106)
Creatinine, Ser: 0.73 mg/dL (ref 0.57–1.00)
GFR calc Af Amer: 130 mL/min/{1.73_m2} (ref >59)
GFR calc non Af Amer: 112 mL/min/{1.73_m2} (ref >59)
Glucose: 99 mg/dL (ref 65–99)
Potassium: 4.3 mmol/L (ref 3.5–5.2)
Sodium: 139 mmol/L (ref 134–144)

## 2018-07-10 LAB — VITAMIN D 25 HYDROXY (VIT D DEFICIENCY, FRACTURES): Vit D, 25-Hydroxy: 21.1 ng/mL — ABNORMAL LOW (ref 30.0–100.0)

## 2018-07-10 LAB — LIPID PANEL
Chol/HDL Ratio: 2.7 ratio (ref 0.0–4.4)
Cholesterol, Total: 172 mg/dL (ref 100–199)
HDL: 63 mg/dL (ref >39)
LDL Calculated: 80 mg/dL (ref 0–99)
Triglycerides: 144 mg/dL (ref 0–149)
VLDL Cholesterol Cal: 29 mg/dL (ref 5–40)

## 2018-07-10 LAB — ALT: ALT: 14 IU/L (ref 0–32)

## 2018-07-10 LAB — AST: AST: 18 IU/L (ref 0–40)

## 2018-07-10 MED ORDER — VITAMIN D (ERGOCALCIFEROL) 1.25 MG (50000 UNIT) PO CAPS: 50000.0000 [IU] | ORAL_CAPSULE | ORAL | 3 refills | Status: DC

## 2018-08-03 ENCOUNTER — Ambulatory Visit
Admission: EM | Admit: 2018-08-03 | Discharge: 2018-08-03 | Disposition: A | Discharge status: Home / Self Care | Payer: 59 | Attending: Physician Assistant | Admitting: Physician Assistant

## 2018-08-03 ENCOUNTER — Ambulatory Visit (INDEPENDENT_AMBULATORY_CARE_PROVIDER_SITE_OTHER): Payer: 59

## 2018-08-03 ENCOUNTER — Other Ambulatory Visit: Payer: Self-pay

## 2018-08-03 DIAGNOSIS — M79674 Pain in right toe(s): Secondary | ICD-10-CM | POA: Diagnosis not present

## 2018-08-03 DIAGNOSIS — S99921A Unspecified injury of right foot, initial encounter: Secondary | ICD-10-CM | POA: Diagnosis not present

## 2018-08-03 NOTE — ED Provider Notes (Signed)
[facility]    CSN: [id number] Arrival date & time: 08/03/18  1248     History   Chief Complaint Chief Complaint  Patient presents with  . Foot Injury    HPI Jenna Lane is a 28 y.o. female.   28 year old female comes in for few day history of right first great toe pain after injury. She was trying to kick a football and hit sand first before the ball. She was not wearing shoes. Since then, has had swelling, contusion to the area with tender to palpation and movement. Denies numbness/tingling.      Past Medical History:  Diagnosis Date  . Abnormal Pap smear of cervix 3/11   h/o AGUS pap  . Concussion 11/04/2015   speaker fell on her head  . Headache(784.0)    quite a few years ago  . 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 MRSA infection 2009   on skin  . Right ankle injury     Patient Active Problem List   Diagnosis Date Noted  . Post-concussion headache 02/27/2016  . Postconcussion syndrome 11/22/2015    Past Surgical History:  Procedure Laterality Date  . COLPOSCOPY  2011  . skin cyst excision     left axillary  . TONSILLECTOMY AND ADENOIDECTOMY N/A 01/02/2013   Procedure: TONSILLECTOMY ;  Surgeon: Jodi Marble, MD;  Location: [facility];  Service: ENT;  Laterality: N/A;  . WISDOM TOOTH EXTRACTION      OB History    Gravida  0   Para  0   Term  0   Preterm  0   AB  0   Living  0     SAB  0   TAB  0   Ectopic  0   Multiple  0   Live Births               Home Medications    Prior to Admission medications   Medication Sig Start Date End Date Taking? Authorizing Provider  amitriptyline (ELAVIL) 25 MG tablet Take 1 tablet (25 mg total) by mouth at bedtime. 07/09/18   Ronnald Nian, DO  etonogestrel-ethinyl estradiol (NUVARING) 0.12-0.015 MG/24HR vaginal ring Place 1 each vaginally every 28 (twenty-eight) days. Insert vaginally and leave in place for 3 consecutive weeks, then remove for 1 week. 08/06/17   Regina Eck, CNM   ibuprofen (ADVIL,MOTRIN) 800 MG tablet Take 800 mg by mouth 3 (three) times daily as needed. 06/19/17   [provider]  VENTOLIN HFA 108 (90 Base) MCG/ACT inhaler  10/30/16   [provider]  Vitamin D, Ergocalciferol, (DRISDOL) 1.25 MG (50000 UT) CAPS capsule Take 1 capsule (50,000 Units total) by mouth every 7 (seven) days. 07/10/18   CiriglianoGarvin Fila, DO    Family History Family History  Problem Relation Age of Onset  . Hyperlipidemia Father   . Hypertension Father        ?  Marland Kitchen Heart disease Paternal Grandfather   . Depression Maternal Uncle   . Lung cancer Maternal Grandfather     Social History Social History   Tobacco Use  . Smoking status: Never Smoker  . Smokeless tobacco: Never Used  Substance Use Topics  . Alcohol use: Yes    Alcohol/week: 2.0 standard drinks    Types: 2 Standard drinks or equivalent per week  . Drug use: No     Allergies   [name] [drospirenone-ethinyl estradiol]   Review of Systems Review of Systems  Reason  unable to perform ROS: See HPI as above.     Physical Exam Triage Vital Signs ED Triage Vitals  Enc Vitals Group     BP 08/03/18 1307 130/85     Pulse Rate 08/03/18 1307 (!) 106     Resp 08/03/18 1307 18     Temp 08/03/18 1307 98.3 F (36.8 C)     Temp Source 08/03/18 1307 Oral     SpO2 08/03/18 1307 97 %     Weight --      Height --      Head Circumference --      Peak Flow --      Pain Score 08/03/18 1258 2     Pain Loc --      Pain Edu? --      Excl. in [place]? --    No data found.  Updated Vital Signs BP 130/85 (BP Location: Left Arm)   Pulse (!) 106   Temp 98.3 F (36.8 C) (Oral)   Resp 18   SpO2 97%   Visual Acuity Right Eye Distance:   Left Eye Distance:   Bilateral Distance:    Right Eye Near:   Left Eye Near:    Bilateral Near:     Physical Exam Constitutional:      General: She is not in acute distress.    Appearance: She is well-developed. She is not diaphoretic.  HENT:     Head:  Normocephalic and atraumatic.  Eyes:     Conjunctiva/sclera: Conjunctivae normal.     Pupils: Pupils are equal, round, and reactive to light.  Musculoskeletal:     Comments: Mild swelling with dorsal contusion along distal 1st MTP and MTPJ. Tenderness to palpation along similar area. Full ROM of toe, though with pain. Strength deferred. Sensation intact. Pedal pulse 2+  Neurological:     Mental Status: She is alert and oriented to person, place, and time.      UC Treatments / Results  Labs (all labs ordered are listed, but only abnormal results are displayed) Labs Reviewed - No data to display  EKG   Radiology Dg Foot Complete Right  Result Date: 08/03/2018 CLINICAL DATA:  28 year old who injured the RIGHT great toe 3 days ago, with bruising, pain and tenderness. Initial encounter. EXAM: RIGHT FOOT COMPLETE - 3+ VIEW COMPARISON:  None. FINDINGS: No evidence of acute or subacute fracture or dislocation. Minimal hallux valgus. Well-preserved joint spaces. Well-preserved bone mineral density. IMPRESSION: No acute or subacute osseous abnormality. Minimal hallux valgus. Electronically Signed   By: Evangeline Dakin M.D.   On: 08/03/2018 13:09    Procedures Procedures (including critical care time)  Medications Ordered in UC Medications - No data to display  Initial Impression / Assessment and Plan / UC Course  I have reviewed the triage vital signs and the nursing notes.  Pertinent labs & imaging results that were available during my care of the patient were reviewed by me and considered in my medical decision making (see chart for details).    Xray negative. Symptomatic treatment discussed. Return precautions given.   Final Clinical Impressions(s) / UC Diagnoses   Final diagnoses:  Great toe pain, right   Discharge Instructions   None    ED Prescriptions    None        Ok Edwards, PA-C 08/03/18 1525

## 2018-08-03 NOTE — ED Triage Notes (Signed)
Pt c/o rt foot injury on Friday, pain to top of rt foot with bruising noted

## 2018-08-10 DIAGNOSIS — S93601A Unspecified sprain of right foot, initial encounter: Secondary | ICD-10-CM | POA: Insufficient documentation

## 2018-08-10 DIAGNOSIS — S93621A Sprain of tarsometatarsal ligament of right foot, initial encounter: Secondary | ICD-10-CM | POA: Diagnosis not present

## 2018-08-10 DIAGNOSIS — M25571 Pain in right ankle and joints of right foot: Secondary | ICD-10-CM | POA: Diagnosis not present

## 2018-08-20 ENCOUNTER — Other Ambulatory Visit: Payer: Self-pay | Admitting: Certified Nurse Midwife

## 2018-08-20 DIAGNOSIS — Z3044 Encounter for surveillance of vaginal ring hormonal contraceptive device: Secondary | ICD-10-CM

## 2018-08-20 NOTE — Telephone Encounter (Signed)
Medication refill request: Nuvaring  Last AEX:  08-06-17 DL  Next AEX: 09-16-2018 Last MMG (if hormonal medication request): n/a Refill authorized: Today, please advise.   Medication pended for #1, 0RF to [facility] pharmacy per patient request. Please refill if appropriate.

## 2018-08-21 ENCOUNTER — Other Ambulatory Visit: Payer: Self-pay | Admitting: Certified Nurse Midwife

## 2018-08-21 DIAGNOSIS — Z3044 Encounter for surveillance of vaginal ring hormonal contraceptive device: Secondary | ICD-10-CM

## 2018-08-21 NOTE — Telephone Encounter (Signed)
Medication refill request:Nuvaring #1, 1R Last AEX:  08-06-17 Next AEX: 09-16-18 Last MMG (if hormonal medication request): n/a Refill authorized: Please advise

## 2018-09-16 ENCOUNTER — Ambulatory Visit (INDEPENDENT_AMBULATORY_CARE_PROVIDER_SITE_OTHER): Payer: 59 | Admitting: Certified Nurse Midwife

## 2018-09-16 ENCOUNTER — Encounter: Payer: Self-pay | Admitting: Certified Nurse Midwife

## 2018-09-16 ENCOUNTER — Other Ambulatory Visit: Payer: Self-pay

## 2018-09-16 VITALS — BP 110/70 | HR 64 | Temp 97.2°F | Resp 16 | Ht 63.25 in | Wt 195.0 lb

## 2018-09-16 DIAGNOSIS — R634 Abnormal weight loss: Secondary | ICD-10-CM

## 2018-09-16 DIAGNOSIS — Z01419 Encounter for gynecological examination (general) (routine) without abnormal findings: Secondary | ICD-10-CM

## 2018-09-16 DIAGNOSIS — Z3044 Encounter for surveillance of vaginal ring hormonal contraceptive device: Secondary | ICD-10-CM | POA: Diagnosis not present

## 2018-09-16 MED ORDER — ETONOGESTREL-ETHINYL ESTRADIOL 0.12-0.015 MG/24HR VA RING: VAGINAL_RING | VAGINAL | 12 refills | Status: DC

## 2018-09-16 NOTE — Patient Instructions (Signed)
General topics  Next pap or exam is  due in 1 year Take a Women's multivitamin Take 1200 mg. of calcium daily - prefer dietary If any concerns in interim to call back  Breast Self-Awareness Practicing breast self-awareness may pick up problems early, prevent significant medical complications, and possibly save your life. By practicing breast self-awareness, you can become familiar with how your breasts look and feel and if your breasts are changing. This allows you to notice changes early. It can also offer you some reassurance that your breast health is good. One way to learn what is normal for your breasts and whether your breasts are changing is to do a breast self-exam. If you find a lump or something that was not present in the past, it is best to contact your caregiver right away. Other findings that should be evaluated by your caregiver include nipple discharge, especially if it is bloody; skin changes or reddening; areas where the skin seems to be pulled in (retracted); or new lumps and bumps. Breast pain is seldom associated with cancer (malignancy), but should also be evaluated by a caregiver. BREAST SELF-EXAM The best time to examine your breasts is 5 7 days after your menstrual period is over.  ExitCare Patient Information 2013 [place].   Exercise to Stay Healthy Exercise helps you become and stay healthy. EXERCISE IDEAS AND TIPS Choose exercises that:  You enjoy.  Fit into your day. You do not need to exercise really hard to be healthy. You can do exercises at a slow or medium level and stay healthy. You can:  Stretch before and after working out.  Try yoga, Pilates, or tai chi.  Lift weights.  Walk fast, swim, jog, run, climb stairs, bicycle, dance, or rollerskate.  Take aerobic classes. Exercises that burn about 150 calories:  Running 1  miles in 15 minutes.  Playing volleyball for 45 to 60 minutes.  Washing and waxing a car for 45 to 60 minutes.   Playing touch football for 45 minutes.  Walking 1  miles in 35 minutes.  Pushing a stroller 1  miles in 30 minutes.  Playing basketball for 30 minutes.  Raking leaves for 30 minutes.  Bicycling 5 miles in 30 minutes.  Walking 2 miles in 30 minutes.  Dancing for 30 minutes.  Shoveling snow for 15 minutes.  Swimming laps for 20 minutes.  Walking up stairs for 15 minutes.  Bicycling 4 miles in 15 minutes.  Gardening for 30 to 45 minutes.  Jumping rope for 15 minutes.  Washing windows or floors for 45 to 60 minutes. Document Released: 02/02/2010 Document Revised: 03/25/2011 Document Reviewed: 02/02/2010 [facility] Patient Information 2013 [place].   Other topics ( that may be useful information):    Sexually Transmitted Disease Sexually transmitted disease (STD) refers to any infection that is passed from person to person during sexual activity. This may happen by way of saliva, semen, blood, vaginal mucus, or urine. Common STDs include:  Gonorrhea.  Chlamydia.  Syphilis.  HIV/AIDS.  Genital herpes.  Hepatitis B and C.  Trichomonas.  Human papillomavirus (HPV).  Pubic lice. CAUSES  An STD may be spread by bacteria, virus, or parasite. A person can get an STD by:  Sexual intercourse with an infected person.  Sharing sex toys with an infected person.  Sharing needles with an infected person.  Having intimate contact with the genitals, mouth, or rectal areas of an infected person. SYMPTOMS  Some people may not have any symptoms, but  they can still pass the infection to others. Different STDs have different symptoms. Symptoms include:  Painful or bloody urination.  Pain in the pelvis, abdomen, vagina, anus, throat, or eyes.  Skin rash, itching, irritation, growths, or sores (lesions). These usually occur in the genital or anal area.  Abnormal vaginal discharge.  Penile discharge in men.  Soft, flesh-colored skin growths in the genital  or anal area.  Fever.  Pain or bleeding during sexual intercourse.  Swollen glands in the groin area.  Yellow skin and eyes (jaundice). This is seen with hepatitis. DIAGNOSIS  To make a diagnosis, your caregiver may:  Take a medical history.  Perform a physical exam.  Take a specimen (culture) to be examined.  Examine a sample of discharge under a microscope.  Perform blood test TREATMENT   Chlamydia, gonorrhea, trichomonas, and syphilis can be cured with antibiotic medicine.  Genital herpes, hepatitis, and HIV can be treated, but not cured, with prescribed medicines. The medicines will lessen the symptoms.  Genital warts from HPV can be treated with medicine or by freezing, burning (electrocautery), or surgery. Warts may come back.  HPV is a virus and cannot be cured with medicine or surgery.However, abnormal areas may be followed very closely by your caregiver and may be removed from the cervix, vagina, or vulva through office procedures or surgery. If your diagnosis is confirmed, your recent sexual partners need treatment. This is true even if they are symptom-free or have a negative culture or evaluation. They should not have sex until their caregiver says it is okay. HOME CARE INSTRUCTIONS  All sexual partners should be informed, tested, and treated for all STDs.  Take your antibiotics as directed. Finish them even if you start to feel better.  Only take over-the-counter or prescription medicines for pain, discomfort, or fever as directed by your caregiver.  Rest.  Eat a balanced diet and drink enough fluids to keep your urine clear or pale yellow.  Do not have sex until treatment is completed and you have followed up with your caregiver. STDs should be checked after treatment.  Keep all follow-up appointments, Pap tests, and blood tests as directed by your caregiver.  Only use latex condoms and water-soluble lubricants during sexual activity. Do not use petroleum  jelly or oils.  Avoid alcohol and illegal drugs.  Get vaccinated for HPV and hepatitis. If you have not received these vaccines in the past, talk to your caregiver about whether one or both might be right for you.  Avoid risky sex practices that can break the skin. The only way to avoid getting an STD is to avoid all sexual activity.Latex condoms and dental dams (for oral sex) will help lessen the risk of getting an STD, but will not completely eliminate the risk. SEEK MEDICAL CARE IF:   You have a fever.  You have any new or worsening symptoms. Document Released: 03/23/2002 Document Revised: 03/25/2011 Document Reviewed: 03/30/2010 [facility] Patient Information 2013 [place].    Domestic Abuse You are being battered or abused if someone close to you hits, pushes, or physically hurts you in any way. You also are being abused if you are forced into activities. You are being sexually abused if you are forced to have sexual contact of any kind. You are being emotionally abused if you are made to feel worthless or if you are constantly threatened. It is important to remember that help is available. No one has the right to abuse you. PREVENTION OF FURTHER  ABUSE  Learn the warning signs of danger. This varies with situations but may include: the use of alcohol, threats, isolation from friends and family, or forced sexual contact. Leave if you feel that violence is going to occur.  If you are attacked or beaten, report it to the police so the abuse is documented. You do not have to press charges. The police can protect you while you or the attackers are leaving. Get the officer's name and badge number and a copy of the report.  Find someone you can trust and tell them what is happening to you: your caregiver, a nurse, clergy member, close friend or family member. Feeling ashamed is natural, but remember that you have done nothing wrong. No one deserves abuse. Document Released: 12/29/1999  Document Revised: 03/25/2011 Document Reviewed: 03/08/2010 [facility] Patient Information 2013 [place].    How Much is Too Much Alcohol? Drinking too much alcohol can cause injury, accidents, and health problems. These types of problems can include:   Car crashes.  Falls.  Family fighting (domestic violence).  Drowning.  Fights.  Injuries.  Burns.  Damage to certain organs.  Having a baby with birth defects. ONE DRINK CAN BE TOO MUCH WHEN YOU ARE:  Working.  Pregnant or breastfeeding.  Taking medicines. Ask your doctor.  Driving or planning to drive. If you or someone you know has a drinking problem, get help from a doctor.  Document Released: 10/27/2008 Document Revised: 03/25/2011 Document Reviewed: 10/27/2008 [facility] Patient Information 2013 [place].   Smoking Hazards Smoking cigarettes is extremely bad for your health. Tobacco smoke has over 200 known poisons in it. There are over 60 chemicals in tobacco smoke that cause cancer. Some of the chemicals found in cigarette smoke include:   Cyanide.  Benzene.  Formaldehyde.  Methanol (wood alcohol).  Acetylene (fuel used in welding torches).  Ammonia. Cigarette smoke also contains the poisonous gases nitrogen oxide and carbon monoxide.  Cigarette smokers have an increased risk of many serious medical problems and Smoking causes approximately:  90% of all lung cancer deaths in men.  80% of all lung cancer deaths in women.  90% of deaths from chronic obstructive lung disease. Compared with nonsmokers, smoking increases the risk of:  Coronary heart disease by 2 to 4 times.  Stroke by 2 to 4 times.  Men developing lung cancer by 23 times.  Women developing lung cancer by 13 times.  Dying from chronic obstructive lung diseases by 12 times.  . Smoking is the most preventable cause of death and disease in our society.  WHY IS SMOKING ADDICTIVE?  Nicotine is the chemical agent in  tobacco that is capable of causing addiction or dependence.  When you smoke and inhale, nicotine is absorbed rapidly into the bloodstream through your lungs. Nicotine absorbed through the lungs is capable of creating a powerful addiction. Both inhaled and non-inhaled nicotine may be addictive.  Addiction studies of cigarettes and spit tobacco show that addiction to nicotine occurs mainly during the teen years, when young people begin using tobacco products. WHAT ARE THE BENEFITS OF QUITTING?  There are many health benefits to quitting smoking.   Likelihood of developing cancer and heart disease decreases. Health improvements are seen almost immediately.  Blood pressure, pulse rate, and breathing patterns start returning to normal soon after quitting. QUITTING SMOKING   American Lung Association - 1-800-LUNGUSA  American Cancer Society - 1-800-ACS-2345 Document Released: 02/08/2004 Document Revised: 03/25/2011 Document Reviewed: 10/12/2008 [facility] Patient Information 2013 [place].   Stress Management Stress is a state of physical or mental tension that often results from changes in your life or normal routine. Some common causes of stress are:  Death of a loved one.  Injuries or severe illnesses.  Getting fired or changing jobs.  Moving into a new home. Other causes may be:  Sexual problems.  Business or financial losses.  Taking on a large debt.  Regular conflict with someone at home or at work.  Constant tiredness from lack of sleep. It is not just bad things that are stressful. It may be stressful to:  Win the lottery.  Get married.  Buy a new car. The amount of stress that can be easily tolerated varies from person to person. Changes generally cause stress, regardless of the types of change. Too much stress can affect your health. It may lead to physical or emotional problems. Too little stress (boredom) may also become stressful. SUGGESTIONS TO REDUCE  STRESS:  Talk things over with your family and friends. It often is helpful to share your concerns and worries. If you feel your problem is serious, you may want to get help from a professional counselor.  Consider your problems one at a time instead of lumping them all together. Trying to take care of everything at once may seem impossible. List all the things you need to do and then start with the most important one. Set a goal to accomplish 2 or 3 things each day. If you expect to do too many in a single day you will naturally fail, causing you to feel even more stressed.  Do not use alcohol or drugs to relieve stress. Although you may feel better for a short time, they do not remove the problems that caused the stress. They can also be habit forming.  Exercise regularly - at least 3 times per week. Physical exercise can help to relieve that "uptight" feeling and will relax you.  The shortest distance between despair and hope is often a good night's sleep.  Go to bed and get up on time allowing yourself time for appointments without being rushed.  Take a short "time-out" period from any stressful situation that occurs during the day. Close your eyes and take some deep breaths. Starting with the muscles in your face, tense them, hold it for a few seconds, then relax. Repeat this with the muscles in your neck, shoulders, hand, stomach, back and legs.  Take good care of yourself. Eat a balanced diet and get plenty of rest.  Schedule time for having fun. Take a break from your daily routine to relax. HOME CARE INSTRUCTIONS   Call if you feel overwhelmed by your problems and feel you can no longer manage them on your own.  Return immediately if you feel like hurting yourself or someone else. Document Released: 06/26/2000 Document Revised: 03/25/2011 Document Reviewed: 02/16/2007 [facility] Patient Information 2013 [place].

## 2018-09-16 NOTE — Progress Notes (Signed)
28 y.o. G0P0000 Single  Caucasian Fe here for annual exam. Periods normal, no issues. Working on weight loss, getting married 12/04/2018! No medication change. Contraception Nuvaring working well. Will use continuous use Nuvaring around wedding time. Sees PCP Dr. Bryan Lemma and labs, all normal. No health concerns today.  LMP 08/19/2018         Sexually active: Yes.    The current method of family planning is NuvaRing vaginal inserts.    Exercising: Yes.    walking Smoker:  no  Review of Systems  Constitutional: Negative.   HENT: Negative.   Eyes: Negative.   Respiratory: Negative.   Cardiovascular: Negative.   Gastrointestinal: Negative.   Genitourinary: Negative.   Musculoskeletal: Negative.   Skin: Negative.   Neurological: Negative.   Endo/Heme/Allergies: Negative.   Psychiatric/Behavioral: Negative.     Health Maintenance: Pap:  06-26-16 neg History of Abnormal Pap: yes AGUS pap yrs ago MMG:  none Self Breast exams: occ Colonoscopy:  none BMD:   none TDaP:  2019 Shingles: no Pneumonia: no Hep C and HIV: both neg 2016 Labs: if needed   reports that she has never smoked. She has never used smokeless tobacco. She reports current alcohol use of about 2.0 standard drinks of alcohol per week. She reports that she does not use drugs.  Past Medical History:  Diagnosis Date  . Abnormal Pap smear of cervix 3/11   h/o AGUS pap  . Concussion 11/04/2015   speaker fell on her head  . Headache(784.0)    quite a few years ago  . 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 MRSA infection 2009   on skin  . Right ankle injury     Past Surgical History:  Procedure Laterality Date  . COLPOSCOPY  2011  . skin cyst excision     left axillary  . TONSILLECTOMY AND ADENOIDECTOMY N/A 01/02/2013   Procedure: TONSILLECTOMY ;  Surgeon: Jodi Marble, MD;  Location: [facility];  Service: ENT;  Laterality: N/A;  . WISDOM TOOTH EXTRACTION      Current Outpatient Medications  Medication Sig Dispense Refill  .  amitriptyline (ELAVIL) 25 MG tablet Take 1 tablet (25 mg total) by mouth at bedtime. 90 tablet 0  . etonogestrel-ethinyl estradiol (NUVARING) 0.12-0.015 MG/24HR vaginal ring INSERT 1 VAGINALLY AND LEAVE IN PLACE FOR 3 CONSECUTIVE WEEKS, THEN REMOVE FOR 1 WEEK. 1 each 1  . ibuprofen (ADVIL,MOTRIN) 800 MG tablet Take 800 mg by mouth 3 (three) times daily as needed.  0  . VENTOLIN HFA 108 (90 Base) MCG/ACT inhaler   1  . Vitamin D, Ergocalciferol, (DRISDOL) 1.25 MG (50000 UT) CAPS capsule Take 1 capsule (50,000 Units total) by mouth every 7 (seven) days. 5 capsule 3   No current facility-administered medications for this visit.     Family History  Problem Relation Age of Onset  . Hyperlipidemia Father   . Hypertension Father        ?  Marland Kitchen Heart disease Paternal Grandfather   . Depression Maternal Uncle   . Lung cancer Maternal Grandfather     ROS:  Pertinent items are noted in HPI.  Otherwise, a comprehensive ROS was negative.  Exam:   There were no vitals taken for this visit.   Ht Readings from Last 3 Encounters:  07/09/18 5\' 3"  (1.6 m)  08/06/17 5' 3.75" (1.619 m)  11/14/16 5' 3.25" (1.607 m)    General appearance: alert, cooperative and appears stated age Head: Normocephalic, without obvious abnormality, atraumatic Neck: no adenopathy, supple,  symmetrical, trachea midline and thyroid normal to inspection and palpation Lungs: clear to auscultation bilaterally Breasts: normal appearance, no masses or tenderness, No nipple retraction or dimpling, No nipple discharge or bleeding, No axillary or supraclavicular adenopathy Heart: regular rate and rhythm Abdomen: soft, non-tender; no masses,  no organomegaly Extremities: extremities normal, atraumatic, no cyanosis or edema Skin: Skin color, texture, turgor normal. No rashes or lesions Lymph nodes: Cervical, supraclavicular, and axillary nodes normal. No abnormal inguinal nodes palpated Neurologic: Grossly normal   Pelvic: External  genitalia:  no lesions              Urethra:  normal appearing urethra with no masses, tenderness or lesions              Bartholin's and Skene's: normal                 Vagina: normal appearing vagina with normal color and discharge, no lesions              Cervix: no cervical motion tenderness, no lesions and nulliparous appearance              Pap taken: No. Bimanual Exam:  Uterus:  normal size, contour, position, consistency, mobility, non-tender mid position              Adnexa: normal adnexa and no mass, fullness, tenderness               Rectovaginal: Confirms               Anus:  normal appearance no lesions  Chaperone present: yes  A:  Well Woman with normal exam  Contraception Nuvaring desired  Weight loss in progress  P:   Reviewed health and wellness pertinent to exam  Risks/benefits/warning signs reviewed, desires Rx.  Rx Nuvaring see order with instructions  Encouraged to continue weight loss journey with exercise for good health  Pap smear: no  counseled on STD prevention, HIV risk factors and prevention, feminine hygiene, adequate intake of calcium and vitamin D, diet and exercise, flu vaccine  return annually or prn  An After Visit Summary was printed and given to the patient.

## 2018-09-19 ENCOUNTER — Encounter: Payer: Self-pay | Admitting: Family Medicine

## 2018-10-07 ENCOUNTER — Other Ambulatory Visit: Payer: Self-pay | Admitting: Family Medicine

## 2018-10-07 DIAGNOSIS — F418 Other specified anxiety disorders: Secondary | ICD-10-CM

## 2018-10-07 MED ORDER — ALPRAZOLAM 0.5 MG PO TABS: ORAL_TABLET | ORAL | 0 refills | Status: DC

## 2019-03-05 ENCOUNTER — Encounter: Payer: Self-pay | Admitting: Family Medicine

## 2019-03-05 DIAGNOSIS — E559 Vitamin D deficiency, unspecified: Secondary | ICD-10-CM

## 2019-03-29 ENCOUNTER — Encounter: Payer: Self-pay | Admitting: Certified Nurse Midwife

## 2019-03-31 ENCOUNTER — Encounter: Payer: Self-pay | Admitting: Certified Nurse Midwife

## 2019-05-25 DIAGNOSIS — H5213 Myopia, bilateral: Secondary | ICD-10-CM | POA: Diagnosis not present

## 2019-05-25 DIAGNOSIS — H52203 Unspecified astigmatism, bilateral: Secondary | ICD-10-CM | POA: Diagnosis not present

## 2019-09-29 ENCOUNTER — Other Ambulatory Visit (HOSPITAL_COMMUNITY): Payer: Self-pay | Admitting: Family Medicine

## 2019-10-12 DIAGNOSIS — Z20828 Contact with and (suspected) exposure to other viral communicable diseases: Secondary | ICD-10-CM | POA: Diagnosis not present

## 2019-10-25 ENCOUNTER — Encounter: Payer: Self-pay | Admitting: Family Medicine

## 2019-10-28 ENCOUNTER — Other Ambulatory Visit: Payer: Self-pay

## 2019-10-29 ENCOUNTER — Ambulatory Visit: Payer: 59 | Admitting: Family Medicine

## 2019-10-29 ENCOUNTER — Encounter: Payer: Self-pay | Admitting: Family Medicine

## 2019-10-29 VITALS — BP 122/80 | HR 81 | Temp 98.0°F | Ht 63.25 in | Wt 213.8 lb

## 2019-10-29 DIAGNOSIS — E559 Vitamin D deficiency, unspecified: Secondary | ICD-10-CM

## 2019-10-29 DIAGNOSIS — K580 Irritable bowel syndrome with diarrhea: Secondary | ICD-10-CM | POA: Diagnosis not present

## 2019-10-29 MED ORDER — DICYCLOMINE HCL 20 MG PO TABS: 20.0000 mg | ORAL_TABLET | Freq: Three times a day (TID) | ORAL | 1 refills | Status: DC

## 2019-10-29 NOTE — Progress Notes (Signed)
Jenna Lane is a 29 y.o. female  Chief Complaint  Patient presents with  . Follow-up    f/u IBS & Vitamin D,     HPI: Jenna Lane is a 29 y.o. female who is seen today in f/u for IBS w/ diarrhea. She was last seen in person by me in 06/2018 was was Rx'd amitriptyline 25mg  qHS. She did not like having to take something daily.  Pt recently took bentyl while on a vacation and felt symptoms (diarrhea, abd cramping) were very well controlled taking 1-2x/day. She would like Rx for this.   She also has a h/o Vit D deficiency, took 50,000IU weekly x 3 mo in 2020. She would like Vit D level rechecked.   Past Medical History:  Diagnosis Date  . Abnormal Pap smear of cervix 3/11   h/o AGUS pap  . Concussion 11/04/2015   speaker fell on her head  . Headache(784.0)    quite a few years ago  . 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 MRSA infection 2009   on skin  . Right ankle injury     Past Surgical History:  Procedure Laterality Date  . COLPOSCOPY  2011  . skin cyst excision     left axillary  . TONSILLECTOMY AND ADENOIDECTOMY N/A 01/02/2013   Procedure: TONSILLECTOMY ;  Surgeon: Jodi Marble, MD;  Location: [facility];  Service: ENT;  Laterality: N/A;  . WISDOM TOOTH EXTRACTION      Social History   Socioeconomic History  . Marital status: Single    Spouse name: Not on file  . Number of children: 0  . Years of education: Associates  . Highest education level: Not on file  Occupational History  . Occupation: Biochemist, clinical- xray tech  Tobacco Use  . Smoking status: Never Smoker  . Smokeless tobacco: Never Used  Substance and Sexual Activity  . Alcohol use: Yes    Alcohol/week: 2.0 standard drinks    Types: 2 Standard drinks or equivalent per week  . Drug use: No  . Sexual activity: Yes    Partners: Male    Birth control/protection: Inserts    Comment: nuvaring  Other Topics Concern  . Not on file  Social History Narrative   Lives alone   Caffeine use: 1 cup daily    Right-handed   Social Determinants of Health   Financial Resource Strain:   . Difficulty of Paying Living Expenses: Not on file  Food Insecurity:   . Worried About Charity fundraiser in the Last Year: Not on file  . Ran Out of Food in the Last Year: Not on file  Transportation Needs:   . Lack of Transportation (Medical): Not on file  . Lack of Transportation (Non-Medical): Not on file  Physical Activity:   . Days of Exercise per Week: Not on file  . Minutes of Exercise per Session: Not on file  Stress:   . Feeling of Stress : Not on file  Social Connections:   . Frequency of Communication with Friends and Family: Not on file  . Frequency of Social Gatherings with Friends and Family: Not on file  . Attends Religious Services: Not on file  . Active Member of Clubs or Organizations: Not on file  . Attends Archivist Meetings: Not on file  . Marital Status: Not on file  Intimate Partner Violence:   . Fear of Current or Ex-Partner: Not on file  . Emotionally Abused: Not on file  . Physically  Abused: Not on file  . Sexually Abused: Not on file    Family History  Problem Relation Age of Onset  . Hyperlipidemia Father   . Hypertension Father        ?  Marland Kitchen Heart disease Paternal Grandfather   . Depression Maternal Uncle   . Lung cancer Maternal Grandfather      Immunization History  Administered Date(s) Administered  . Tdap 01/15/2007, 08/06/2017    Outpatient Encounter Medications as of 10/29/2019  Medication Sig  . ALPRAZolam (XANAX) 0.5 MG tablet 0.5-1.0 tab po 30-60 min prior to dental procedure  . Cholecalciferol (VITAMIN D3) 10 MCG (400 UNIT) CAPS Take by mouth.  Marland Kitchen ibuprofen (ADVIL,MOTRIN) 800 MG tablet Take 800 mg by mouth 3 (three) times daily as needed.  Marland Kitchen amitriptyline (ELAVIL) 25 MG tablet Take 1 tablet (25 mg total) by mouth at bedtime. (Patient not taking: Reported on 10/29/2019)  . etonogestrel-ethinyl estradiol (NUVARING) 0.12-0.015 MG/24HR vaginal  ring INSERT 1 VAGINALLY AND LEAVE IN PLACE FOR 3 CONSECUTIVE WEEKS, THEN REMOVE FOR 1 WEEK. (Patient not taking: Reported on 10/29/2019)  . Vitamin D, Ergocalciferol, (DRISDOL) 1.25 MG (50000 UT) CAPS capsule Take 1 capsule (50,000 Units total) by mouth every 7 (seven) days. (Patient not taking: Reported on 10/29/2019)   No facility-administered encounter medications on file as of 10/29/2019.     ROS: Pertinent positives and negatives noted in HPI. Remainder of ROS non-contributory    Allergies  Allergen Reactions  . [name] [Drospirenone-Ethinyl Estradiol] Rash    BP 122/80   Pulse 81   Temp 98 F (36.7 C) (Temporal)   Ht 5' 3.25" (1.607 m)   Wt 213 lb 12.8 oz (97 kg)   SpO2 98%   BMI 37.57 kg/m   Physical Exam Constitutional:      General: She is not in acute distress.    Appearance: Normal appearance. She is not ill-appearing.  Pulmonary:     Effort: No respiratory distress.  Neurological:     Mental Status: She is alert and oriented to person, place, and time.  Psychiatric:        Mood and Affect: Mood normal.        Behavior: Behavior normal.     A/P:  1. Irritable bowel syndrome with diarrhea - much-improved taking bentyl and pt would like to continue with this Refill: - dicyclomine (BENTYL) 20 MG tablet; Take 1 tablet (20 mg total) by mouth 3 (three) times daily before meals.  Dispense: 270 tablet; Refill: 1 - f/u PRN  2. Vitamin D deficiency - taking 400IU but not consistently - VITAMIN D 25 Hydroxy (Vit-D Deficiency, Fractures)  This visit occurred during the SARS-CoV-2 public health emergency.  Safety protocols were in place, including screening questions prior to the visit, additional usage of staff PPE, and extensive cleaning of exam room while observing appropriate contact time as indicated for disinfecting solutions.

## 2019-10-30 LAB — VITAMIN D 25 HYDROXY (VIT D DEFICIENCY, FRACTURES): Vit D, 25-Hydroxy: 31.6 ng/mL (ref 30.0–100.0)

## 2019-12-22 ENCOUNTER — Encounter (INDEPENDENT_AMBULATORY_CARE_PROVIDER_SITE_OTHER): Payer: Self-pay

## 2020-01-20 ENCOUNTER — Ambulatory Visit (INDEPENDENT_AMBULATORY_CARE_PROVIDER_SITE_OTHER): Payer: 59 | Admitting: Family Medicine

## 2020-01-20 ENCOUNTER — Encounter (INDEPENDENT_AMBULATORY_CARE_PROVIDER_SITE_OTHER): Payer: Self-pay | Admitting: Family Medicine

## 2020-01-20 ENCOUNTER — Other Ambulatory Visit: Payer: Self-pay

## 2020-01-20 VITALS — BP 117/75 | HR 69 | Temp 97.7°F | Ht 63.0 in | Wt 216.0 lb

## 2020-01-20 DIAGNOSIS — R5383 Other fatigue: Secondary | ICD-10-CM

## 2020-01-20 DIAGNOSIS — Z1331 Encounter for screening for depression: Secondary | ICD-10-CM

## 2020-01-20 DIAGNOSIS — E559 Vitamin D deficiency, unspecified: Secondary | ICD-10-CM

## 2020-01-20 DIAGNOSIS — Z9189 Other specified personal risk factors, not elsewhere classified: Secondary | ICD-10-CM

## 2020-01-20 DIAGNOSIS — Z6838 Body mass index (BMI) 38.0-38.9, adult: Secondary | ICD-10-CM

## 2020-01-20 DIAGNOSIS — Z0289 Encounter for other administrative examinations: Secondary | ICD-10-CM

## 2020-01-20 DIAGNOSIS — R0602 Shortness of breath: Secondary | ICD-10-CM

## 2020-01-20 DIAGNOSIS — K589 Irritable bowel syndrome without diarrhea: Secondary | ICD-10-CM | POA: Insufficient documentation

## 2020-01-21 LAB — COMPREHENSIVE METABOLIC PANEL
ALT: 17 IU/L (ref 0–32)
AST: 18 IU/L (ref 0–40)
Albumin/Globulin Ratio: 1.8 (ref 1.2–2.2)
Albumin: 4.3 g/dL (ref 3.9–5.0)
Alkaline Phosphatase: 67 IU/L (ref 44–121)
BUN/Creatinine Ratio: 15 (ref 9–23)
BUN: 10 mg/dL (ref 6–20)
Bilirubin Total: 0.4 mg/dL (ref 0.0–1.2)
CO2: 18 mmol/L — ABNORMAL LOW (ref 20–29)
Calcium: 9.3 mg/dL (ref 8.7–10.2)
Chloride: 105 mmol/L (ref 96–106)
Creatinine, Ser: 0.68 mg/dL (ref 0.57–1.00)
GFR calc Af Amer: 136 mL/min/{1.73_m2} (ref >59)
GFR calc non Af Amer: 118 mL/min/{1.73_m2} (ref >59)
Globulin, Total: 2.4 g/dL (ref 1.5–4.5)
Glucose: 87 mg/dL (ref 65–99)
Potassium: 4.5 mmol/L (ref 3.5–5.2)
Sodium: 137 mmol/L (ref 134–144)
Total Protein: 6.7 g/dL (ref 6.0–8.5)

## 2020-01-21 LAB — CBC WITH DIFFERENTIAL/PLATELET
Basophils Absolute: 0 10*3/uL (ref 0.0–0.2)
Basos: 0 %
EOS (ABSOLUTE): 0.2 10*3/uL (ref 0.0–0.4)
Eos: 3 %
Hematocrit: 43 % (ref 34.0–46.6)
Hemoglobin: 14.4 g/dL (ref 11.1–15.9)
Immature Grans (Abs): 0 10*3/uL (ref 0.0–0.1)
Immature Granulocytes: 0 %
Lymphocytes Absolute: 2.3 10*3/uL (ref 0.7–3.1)
Lymphs: 30 %
MCH: 29.1 pg (ref 26.6–33.0)
MCHC: 33.5 g/dL (ref 31.5–35.7)
MCV: 87 fL (ref 79–97)
Monocytes Absolute: 0.3 10*3/uL (ref 0.1–0.9)
Monocytes: 4 %
Neutrophils Absolute: 4.9 10*3/uL (ref 1.4–7.0)
Neutrophils: 63 %
Platelets: 261 10*3/uL (ref 150–450)
RBC: 4.94 x10E6/uL (ref 3.77–5.28)
RDW: 12.4 % (ref 11.7–15.4)
WBC: 7.8 10*3/uL (ref 3.4–10.8)

## 2020-01-21 LAB — INSULIN, RANDOM: INSULIN: 14.8 u[IU]/mL (ref 2.6–24.9)

## 2020-01-21 LAB — LIPID PANEL WITH LDL/HDL RATIO
Cholesterol, Total: 200 mg/dL — ABNORMAL HIGH (ref 100–199)
HDL: 59 mg/dL (ref >39)
LDL Chol Calc (NIH): 124 mg/dL — ABNORMAL HIGH (ref 0–99)
LDL/HDL Ratio: 2.1 ratio (ref 0.0–3.2)
Triglycerides: 94 mg/dL (ref 0–149)
VLDL Cholesterol Cal: 17 mg/dL (ref 5–40)

## 2020-01-21 LAB — VITAMIN B12: Vitamin B-12: 473 pg/mL (ref 232–1245)

## 2020-01-21 LAB — HEMOGLOBIN A1C
Est. average glucose Bld gHb Est-mCnc: 91 mg/dL
Hgb A1c MFr Bld: 4.8 % (ref 4.8–5.6)

## 2020-01-21 LAB — T4: T4, Total: 6.9 ug/dL (ref 4.5–12.0)

## 2020-01-21 LAB — T3: T3, Total: 125 ng/dL (ref 71–180)

## 2020-01-21 LAB — FOLATE: Folate: 6.6 ng/mL (ref >3.0)

## 2020-01-21 LAB — TSH: TSH: 3.33 u[IU]/mL (ref 0.450–4.500)

## 2020-01-21 LAB — VITAMIN D 25 HYDROXY (VIT D DEFICIENCY, FRACTURES): Vit D, 25-Hydroxy: 22.5 ng/mL — ABNORMAL LOW (ref 30.0–100.0)

## 2020-01-27 NOTE — Progress Notes (Signed)
Chief Complaint:   OBESITY Jenna Lane (MR# [id number]) is a 30 y.o. female who presents for evaluation and treatment of obesity and related comorbidities. Current BMI is Body mass index is 38.26 kg/m. Jenna Lane has been struggling with her weight for many years and has been unsuccessful in either losing weight, maintaining weight loss, or reaching her healthy weight goal.  Jenna Lane is planning on getting pregnant later this year and she is trying to improve her health before this.  Jenna Lane is currently in the action stage of change and ready to dedicate time achieving and maintaining a healthier weight. Jenna Lane is interested in becoming our patient and working on intensive lifestyle modifications including (but not limited to) diet and exercise for weight loss.  Jenna Lane's habits were reviewed today and are as follows: Her family eats meals together, she thinks her family will eat healthier with her, her desired weight loss is 41 lbs, she has been heavy most of her life, she started gaining weight within the last 5 years, her heaviest weight ever was 220 pounds, she has significant food cravings issues, she snacks frequently in the evenings, she skips meals frequently, she is frequently drinking liquids with calories, she frequently makes poor food choices, she has problems with excessive hunger, she frequently eats larger portions than normal and she struggles with emotional eating.  Depression Screen Jenna Lane's Food and Mood (modified PHQ-9) score was 17.  Depression screen PHQ 2/9 01/20/2020  Decreased Interest 1  Down, Depressed, Hopeless 2  PHQ - 2 Score 3  Altered sleeping 2  Tired, decreased energy 3  Change in appetite 3  Feeling bad or failure about yourself  2  Trouble concentrating 2  Moving slowly or fidgety/restless 1  Suicidal thoughts 0  PHQ-9 Score 16   Subjective:   1. Other fatigue Jenna Lane admits to daytime somnolence and admits to waking up still tired. Patent has a history  of symptoms of daytime fatigue and morning headache. Jenna Lane generally gets 8 hours of sleep per night, and states that she has generally restful sleep. Snoring is not present. Apneic episodes are not present. Epworth Sleepiness Score is 13.  2. Shortness of breath on exertion Jenna Lane notes increasing shortness of breath with exercising and seems to be worsening over time with weight gain. She notes getting out of breath sooner with activity than she used to. This has not gotten worse recently. Jenna Lane denies shortness of breath at rest or orthopnea.  3. Vitamin D deficiency Jenna Lane has a history of Vit D deficiency. She is not on Vit D currently, and she notes fatigue.  4. At risk for gestational diabetes mellitus Jenna Lane is at increased risk of gestational diabetes due to BMI >30.  Assessment/Plan:   1. Other fatigue Jenna Lane does feel that her weight is causing her energy to be lower than it should be. Fatigue may be related to obesity, depression or many other causes. Labs will be ordered, and in the meanwhile, Jenna Lane will focus on self care including making healthy food choices, increasing physical activity and focusing on stress reduction.  - Vitamin B12 - Folate - Hemoglobin A1c - TSH - T4 - T3 - Lipid Panel With LDL/HDL Ratio - Insulin, random  2. Shortness of breath on exertion Jenna Lane does feel that she gets out of breath more easily that she used to when she exercises. Jenna Lane's shortness of breath appears to be obesity related and exercise induced. She has agreed to work on weight loss  and gradually increase exercise to treat her exercise induced shortness of breath. Will continue to monitor closely.  - CBC with Differential/Platelet - Comprehensive metabolic panel - EKG [id number]  3. Vitamin D deficiency Low Vitamin D level contributes to fatigue and are associated with obesity, breast, and colon cancer. We will check labs today, and Jenna Lane will follow-up for routine testing of Vitamin D, at  least 2-3 times per year to avoid over-replacement.  - VITAMIN D 25 Hydroxy (Vit-D Deficiency, Fractures)  4. Screening for depression Jenna Lane had a positive depression screening. Depression is commonly associated with obesity and often results in emotional eating behaviors. We will monitor this closely and work on CBT to help improve the non-hunger eating patterns. Referral to Psychology may be required if no improvement is seen as she continues in our clinic.  5. At risk for gestational diabetes mellitus Jenna Lane was given approximately 30 minutes of gestational diabetes prevention counseling today. She is 30 y.o. female and has risk factors for gestational diabetes including obesity. We discussed intensive lifestyle modifications today with an emphasis on specific nutritional instructions and strategies.   Repetitive spaced learning was employed today to elicit superior memory formation and behavioral change.  6. Class 2 severe obesity with serious comorbidity and body mass index (BMI) of 38.0 to 38.9 in adult, unspecified obesity type (HCC) Jenna Lane is currently in the action stage of change and her goal is to continue with weight loss efforts. I recommend Jenna Lane begin the structured treatment plan as follows:  She has agreed to the Category 2 Plan + 100 calories.  Exercise goals: No exercise has been prescribed for now, while we concentrate on nutritional changes.   Behavioral modification strategies: increasing lean protein intake and meal planning and cooking strategies.  She was informed of the importance of frequent follow-up visits to maximize her success with intensive lifestyle modifications for her multiple health conditions. She was informed we would discuss her lab results at her next visit unless there is a critical issue that needs to be addressed sooner. Jenna Lane agreed to keep her next visit at the agreed upon time to discuss these results.  Objective:   Blood pressure 117/75, pulse  69, temperature 97.7 F (36.5 C), height 5\' 3"  (1.6 m), weight 216 lb (98 kg), SpO2 98 %. Body mass index is 38.26 kg/m.  EKG: Normal sinus rhythm, rate 70 BPM.  Indirect Calorimeter completed today shows a VO2 of 306 and a REE of 2129.  Her calculated basal metabolic rate is 0623 thus her basal metabolic rate is better than expected.  General: Cooperative, alert, well developed, in no acute distress. HEENT: Conjunctivae and lids unremarkable. Cardiovascular: Regular rhythm.  Lungs: Normal work of breathing. Neurologic: No focal deficits.   Lab Results  Component Value Date   CREATININE 0.68 01/20/2020   BUN 10 01/20/2020   NA 137 01/20/2020   K 4.5 01/20/2020   CL 105 01/20/2020   CO2 18 (L) 01/20/2020   Lab Results  Component Value Date   ALT 17 01/20/2020   AST 18 01/20/2020   ALKPHOS 67 01/20/2020   BILITOT 0.4 01/20/2020   Lab Results  Component Value Date   HGBA1C 4.8 01/20/2020   Lab Results  Component Value Date   INSULIN 14.8 01/20/2020   Lab Results  Component Value Date   TSH 3.330 01/20/2020   Lab Results  Component Value Date   CHOL 200 (H) 01/20/2020   HDL 59 01/20/2020   LDLCALC 124 (H) 01/20/2020  TRIG 94 01/20/2020   CHOLHDL 2.7 07/09/2018   Lab Results  Component Value Date   WBC 7.8 01/20/2020   HGB 14.4 01/20/2020   HCT 43.0 01/20/2020   MCV 87 01/20/2020   PLT 261 01/20/2020   No results found for: IRON, TIBC, FERRITIN  Attestation Statements:   Reviewed by clinician on day of visit: allergies, medications, problem list, medical history, surgical history, family history, social history, and previous encounter notes.   I, Trixie Dredge, am acting as transcriptionist for Dennard Nip, MD.  I have reviewed the above documentation for accuracy and completeness, and I agree with the above. - Dennard Nip, MD

## 2020-02-03 ENCOUNTER — Other Ambulatory Visit (INDEPENDENT_AMBULATORY_CARE_PROVIDER_SITE_OTHER): Payer: Self-pay | Admitting: Family Medicine

## 2020-02-03 ENCOUNTER — Other Ambulatory Visit: Payer: Self-pay

## 2020-02-03 ENCOUNTER — Ambulatory Visit (INDEPENDENT_AMBULATORY_CARE_PROVIDER_SITE_OTHER): Payer: 59 | Admitting: Family Medicine

## 2020-02-03 ENCOUNTER — Encounter (INDEPENDENT_AMBULATORY_CARE_PROVIDER_SITE_OTHER): Payer: Self-pay | Admitting: Family Medicine

## 2020-02-03 VITALS — BP 105/69 | HR 61 | Temp 98.0°F | Ht 63.0 in | Wt 208.0 lb

## 2020-02-03 DIAGNOSIS — E8881 Metabolic syndrome: Secondary | ICD-10-CM | POA: Diagnosis not present

## 2020-02-03 DIAGNOSIS — E7849 Other hyperlipidemia: Secondary | ICD-10-CM

## 2020-02-03 DIAGNOSIS — E559 Vitamin D deficiency, unspecified: Secondary | ICD-10-CM

## 2020-02-03 DIAGNOSIS — Z6836 Body mass index (BMI) 36.0-36.9, adult: Secondary | ICD-10-CM | POA: Diagnosis not present

## 2020-02-03 DIAGNOSIS — Z9189 Other specified personal risk factors, not elsewhere classified: Secondary | ICD-10-CM

## 2020-02-03 MED ORDER — VITAMIN D (ERGOCALCIFEROL) 1.25 MG (50000 UNIT) PO CAPS
50000.0000 [IU] | ORAL_CAPSULE | ORAL | 0 refills | Status: DC
Start: 2020-02-03 — End: 2020-02-03

## 2020-02-07 NOTE — Progress Notes (Signed)
Chief Complaint:   OBESITY Jenna Lane is here to discuss her progress with her obesity treatment plan along with follow-up of her obesity related diagnoses. Jenna Lane is on the Category 2 Plan + 100 calories and states she is following her eating plan approximately 95% of the time. Jenna Lane states she is doing 0 minutes 0 times per week.  Today's visit was #: 2 Starting weight: 216 lbs Starting date: 01/20/2020 Today's weight: 208 lbs Today's date: 02/03/2020 Total lbs lost to date: 8 Total lbs lost since last in-office visit: 8  Interim History: Jenna Lane has done well with weight loss on her Category 2 plan. She did well moving the food around and sometimes she was unable to eat all the breakfast. Her hunger was controlled but she would like more choices for dinner.  Subjective:   1. Vitamin D deficiency Jenna Lane is not on Vit D, and her Vit D level is pretty low. She notes fatigue. I discussed labs with the patient today.  2. Other hyperlipidemia Jenna Lane has a new diagnosis of hyperlipidemia. Her LDL is elevated from 2 years ago. She is not on statin and she is working on diet and weight loss. I discussed labs with the patient today.  3. Insulin resistance Jenna Lane has a new diagnosis of insulin resistance. She has a normal fasting glucose and low normal A1c, but elevated fasting insulin suggesting insulin resistance. She notes some episodes of hypoglycemia in the past. I discussed labs with the patient today.  4. At risk for hypoglycemia Jenna Lane is at increased risk for hypoglycemia due to changes in diet, diagnosis of diabetes, and/or insulin use.  Assessment/Plan:   1. Vitamin D deficiency Low Vitamin D level contributes to fatigue and are associated with obesity, breast, and colon cancer. Jenna Lane agreed to start prescription Vitamin D 50,000 IU every week with no refills. She will follow-up for routine testing of Vitamin D, at least 2-3 times per year to avoid over-replacement.  - Vitamin D,  Ergocalciferol, (DRISDOL) 1.25 MG (50000 UNIT) CAPS capsule; Take 1 capsule (50,000 Units total) by mouth every 7 (seven) days.  Dispense: 4 capsule; Refill: 0  2. Other hyperlipidemia Cardiovascular risk and specific lipid/LDL goals reviewed. We discussed several lifestyle modifications today. Jenna Lane will continue her Category 2 plan, and weight loss, and will monitor her progress. We will recheck labs in 3 months. Orders and follow up as documented in patient record.   3. Insulin resistance Jenna Lane will continue to work on weight loss, diet, exercise, and decreasing simple carbohydrates to help decrease the risk of diabetes. May need to start metformin if polyphagia develops. Jenna Lane agreed to follow-up with [place] as directed to closely monitor her progress.  4. At risk for hypoglycemia Jenna Lane was given approximately 30 minutes of counseling today regarding prevention of hypoglycemia. She was advised of symptoms of hypoglycemia. Jenna Lane was instructed to avoid skipping meals, eat regular protein rich meals and schedule low calorie snacks as needed.   Repetitive spaced learning was employed today to elicit superior memory formation and behavioral change  5. Class 2 severe obesity with serious comorbidity and body mass index (BMI) of 36.0 to 36.9 in adult, unspecified obesity type Jenna Lane) Jenna Lane is currently in the action stage of change. As such, her goal is to continue with weight loss efforts. She has agreed to the Category 2 Plan and keeping a food journal and adhering to recommended goals of 400-600 calories and 40+ grams of protein at supper daily.   Behavioral  modification strategies: increasing lean protein intake and keeping a strict food journal.  Jenna Lane has agreed to follow-up with our clinic in 2 weeks. She was informed of the importance of frequent follow-up visits to maximize her success with intensive lifestyle modifications for her multiple health conditions.   Objective:   Blood pressure  105/69, pulse 61, temperature 98 F (36.7 C), height 5\' 3"  (1.6 m), weight 208 lb (94.3 kg), SpO2 97 %. Body mass index is 36.85 kg/m.  General: Cooperative, alert, well developed, in no acute distress. HEENT: Conjunctivae and lids unremarkable. Cardiovascular: Regular rhythm.  Lungs: Normal work of breathing. Neurologic: No focal deficits.   Lab Results  Component Value Date   CREATININE 0.68 01/20/2020   BUN 10 01/20/2020   NA 137 01/20/2020   K 4.5 01/20/2020   CL 105 01/20/2020   CO2 18 (L) 01/20/2020   Lab Results  Component Value Date   ALT 17 01/20/2020   AST 18 01/20/2020   ALKPHOS 67 01/20/2020   BILITOT 0.4 01/20/2020   Lab Results  Component Value Date   HGBA1C 4.8 01/20/2020   Lab Results  Component Value Date   INSULIN 14.8 01/20/2020   Lab Results  Component Value Date   TSH 3.330 01/20/2020   Lab Results  Component Value Date   CHOL 200 (H) 01/20/2020   HDL 59 01/20/2020   LDLCALC 124 (H) 01/20/2020   TRIG 94 01/20/2020   CHOLHDL 2.7 07/09/2018   Lab Results  Component Value Date   WBC 7.8 01/20/2020   HGB 14.4 01/20/2020   HCT 43.0 01/20/2020   MCV 87 01/20/2020   PLT 261 01/20/2020   No results found for: IRON, TIBC, FERRITIN  Attestation Statements:   Reviewed by clinician on day of visit: allergies, medications, problem list, medical history, surgical history, family history, social history, and previous encounter notes.   I, Trixie Dredge, am acting as transcriptionist for Dennard Nip, MD.  I have reviewed the above documentation for accuracy and completeness, and I agree with the above. -  Dennard Nip, MD

## 2020-02-10 DIAGNOSIS — Z01419 Encounter for gynecological examination (general) (routine) without abnormal findings: Secondary | ICD-10-CM | POA: Diagnosis not present

## 2020-02-10 DIAGNOSIS — Z6836 Body mass index (BMI) 36.0-36.9, adult: Secondary | ICD-10-CM | POA: Diagnosis not present

## 2020-02-21 ENCOUNTER — Ambulatory Visit (INDEPENDENT_AMBULATORY_CARE_PROVIDER_SITE_OTHER): Payer: 59 | Admitting: Adult Health

## 2020-02-21 ENCOUNTER — Encounter (INDEPENDENT_AMBULATORY_CARE_PROVIDER_SITE_OTHER): Payer: Self-pay | Admitting: Adult Health

## 2020-02-21 ENCOUNTER — Other Ambulatory Visit: Payer: Self-pay

## 2020-02-21 VITALS — BP 110/74 | HR 70 | Temp 97.6°F | Ht 63.0 in | Wt 202.0 lb

## 2020-02-21 DIAGNOSIS — E559 Vitamin D deficiency, unspecified: Secondary | ICD-10-CM | POA: Diagnosis not present

## 2020-02-21 DIAGNOSIS — Z6835 Body mass index (BMI) 35.0-35.9, adult: Secondary | ICD-10-CM

## 2020-02-21 DIAGNOSIS — E8881 Metabolic syndrome: Secondary | ICD-10-CM

## 2020-02-21 DIAGNOSIS — K588 Other irritable bowel syndrome: Secondary | ICD-10-CM

## 2020-02-21 LAB — HM PAP SMEAR: HM Pap smear: NEGATIVE

## 2020-02-22 DIAGNOSIS — E66811 Obesity, class 1: Secondary | ICD-10-CM | POA: Insufficient documentation

## 2020-02-22 DIAGNOSIS — E8881 Metabolic syndrome: Secondary | ICD-10-CM | POA: Insufficient documentation

## 2020-02-22 DIAGNOSIS — E559 Vitamin D deficiency, unspecified: Secondary | ICD-10-CM | POA: Insufficient documentation

## 2020-02-22 NOTE — Progress Notes (Signed)
Chief Complaint:   OBESITY Jenna Lane is here to discuss her progress with her obesity treatment plan along with follow-up of her obesity related diagnoses. Jenna Lane is on the Category 2 Plan and states she is following her eating plan approximately 75% of the time. [name] states she is doing 0 minutes 0 times per week.  Today's visit was #: 3 Starting weight: 216 lbs Starting date: 01/20/2020 Today's weight: 202 lbs Today's date: 02/21/2020 Total lbs lost to date: 14 lbs Total lbs lost since last in-office visit: 6 lbs  Interim History: Jenna Lane has been following the Category 2 plan 100% M-F, then relaxing a bit on Saturday and Sunday. She has 3 trips coming up: [place], [place], and [place] [address]. She has planned out how to stay on track while traveling.  Subjective:   1. Vitamin D deficiency Jenna Lane's Vitamin D level was 22.5 on 01/20/2020. She is currently taking prescription vitamin D 50,000 IU each week. She denies nausea, vomiting or muscle weakness. Level is well below goal of 50.   Ref. Range 01/20/2020 08:32  Vitamin D, 25-Hydroxy Latest Ref Range: 30.0 - 100.0 ng/mL 22.5 (L)   2. Insulin resistance 01/20/2020 Shantea had an elevated insulin level of 14.8 with normal blood glucose of 87 and low A1c 4.8. She denies recent hypoglycemia.  Lab Results  Component Value Date   INSULIN 14.8 01/20/2020   Lab Results  Component Value Date   HGBA1C 4.8 01/20/2020    3. Other irritable bowel syndrome Jenna Lane foods and gluten laden foods can trigger diarrhea.  Assessment/Plan:   1. Vitamin D deficiency Low Vitamin D level contributes to fatigue and are associated with obesity, breast, and colon cancer. She agrees to continue to take prescription Vitamin D @50 ,000 IU every week and will follow-up for routine testing of Vitamin D, at least 2-3 times per year to avoid over-replacement. No need for refill today.  2. Insulin resistance [name] will continue to work on weight loss, exercise, and  decreasing simple carbohydrates to help decrease the risk of diabetes. [name] agreed to follow-up with [place] as directed to closely monitor her progress. Continue Category 2 plan.   3. Other irritable bowel syndrome Avoid known foods that will cause GI upset.  4. Class 2 severe obesity with serious comorbidity and body mass index (BMI) of 35.0 to 35.9 in adult, unspecified obesity type (HCC) Jenna Lane is currently in the action stage of change. As such, her goal is to continue with weight loss efforts. She has agreed to the Category 2 Plan.   Exercise goals: Do 1-2 Fitness classes per week.  Behavioral modification strategies: increasing lean protein intake, meal planning and cooking strategies, travel eating strategies and planning for success.  Jenna Lane has agreed to follow-up with our clinic in 2 weeks. She was informed of the importance of frequent follow-up visits to maximize her success with intensive lifestyle modifications for her multiple health conditions.   Objective:   Blood pressure 110/74, pulse 70, temperature 97.6 F (36.4 C), height 5\' 3"  (1.6 m), weight 202 lb (91.6 kg), last menstrual period 02/12/2020, SpO2 98 %. Body mass index is 35.78 kg/m.  General: Cooperative, alert, well developed, in no acute distress. HEENT: Conjunctivae and lids unremarkable. Cardiovascular: Regular rhythm.  Lungs: Normal work of breathing. Neurologic: No focal deficits.   Lab Results  Component Value Date   CREATININE 0.68 01/20/2020   BUN 10 01/20/2020   NA 137 01/20/2020   K 4.5 01/20/2020   CL  105 01/20/2020   CO2 18 (L) 01/20/2020   Lab Results  Component Value Date   ALT 17 01/20/2020   AST 18 01/20/2020   ALKPHOS 67 01/20/2020   BILITOT 0.4 01/20/2020   Lab Results  Component Value Date   HGBA1C 4.8 01/20/2020   Lab Results  Component Value Date   INSULIN 14.8 01/20/2020   Lab Results  Component Value Date   TSH 3.330 01/20/2020   Lab Results  Component Value Date    CHOL 200 (H) 01/20/2020   HDL 59 01/20/2020   LDLCALC 124 (H) 01/20/2020   TRIG 94 01/20/2020   CHOLHDL 2.7 07/09/2018   Lab Results  Component Value Date   WBC 7.8 01/20/2020   HGB 14.4 01/20/2020   HCT 43.0 01/20/2020   MCV 87 01/20/2020   PLT 261 01/20/2020   No results found for: IRON, TIBC, FERRITIN Attestation Statements:   Reviewed by clinician on day of visit: allergies, medications, problem list, medical history, surgical history, family history, social history, and previous encounter notes.  Time spent on visit including pre-visit chart review and post-visit care and charting was 31 minutes.   Coral Ceo, am acting as Location manager for Mina Marble, NP.  I have reviewed the above documentation for accuracy and completeness, and I agree with the above. -  Damari Hiltz d. Sitlaly Gudiel, NP-C

## 2020-03-02 ENCOUNTER — Other Ambulatory Visit: Payer: Self-pay

## 2020-03-03 ENCOUNTER — Encounter: Payer: Self-pay | Admitting: Family Medicine

## 2020-03-03 ENCOUNTER — Other Ambulatory Visit: Payer: Self-pay | Admitting: Family Medicine

## 2020-03-03 ENCOUNTER — Ambulatory Visit (INDEPENDENT_AMBULATORY_CARE_PROVIDER_SITE_OTHER): Payer: 59 | Admitting: Family Medicine

## 2020-03-03 VITALS — BP 112/74 | HR 68 | Temp 98.3°F | Ht 63.0 in | Wt 204.8 lb

## 2020-03-03 DIAGNOSIS — E8881 Metabolic syndrome: Secondary | ICD-10-CM

## 2020-03-03 DIAGNOSIS — Z0000 Encounter for general adult medical examination without abnormal findings: Secondary | ICD-10-CM

## 2020-03-03 DIAGNOSIS — K588 Other irritable bowel syndrome: Secondary | ICD-10-CM | POA: Diagnosis not present

## 2020-03-03 DIAGNOSIS — E559 Vitamin D deficiency, unspecified: Secondary | ICD-10-CM

## 2020-03-03 DIAGNOSIS — F418 Other specified anxiety disorders: Secondary | ICD-10-CM

## 2020-03-03 DIAGNOSIS — E7849 Other hyperlipidemia: Secondary | ICD-10-CM

## 2020-03-03 MED ORDER — ALPRAZOLAM 0.5 MG PO TABS
ORAL_TABLET | ORAL | 0 refills | Status: DC
Start: 2020-03-03 — End: 2020-03-03

## 2020-03-03 NOTE — Progress Notes (Signed)
Jenna Lane is a 30 y.o. female  Chief Complaint  Patient presents with  . Annual Exam    CPE/labs.  No concerns.      HPI: *Jenna Lane is a 30 y.o. female seen today for annual CPE. She had a full panel of labs done last month in 01/2020 with Dr. Leafy Ro so does not need labs today.  Last PAP: UTD - established with GYN Dr. Linda Hedges  Diet/Exercise: has lost 15lbs with improved diet and [facility] and goes to [facility] 2 days per week Dental: UTD on exam Vision: UTD on exam, wears glasses  Med refills needed today? PRN xanax for dental procedures    Past Medical History:  Diagnosis Date  . Abnormal Pap smear of cervix 3/11   h/o AGUS pap  . Concussion 11/04/2015   speaker fell on her head  . Headache(784.0)    quite a few years ago  . 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 IBS (irritable bowel syndrome)   . MRSA infection 2009   on skin  . Right ankle injury   . Vitamin D deficiency     Past Surgical History:  Procedure Laterality Date  . COLPOSCOPY  2011  . skin cyst excision     left axillary  . TONSILLECTOMY AND ADENOIDECTOMY N/A 01/02/2013   Procedure: TONSILLECTOMY ;  Surgeon: Jodi Marble, MD;  Location: [place];  Service: ENT;  Laterality: N/A;  . WISDOM TOOTH EXTRACTION      Social History   Socioeconomic History  . Marital status: Married    Spouse name: Jenna Lane  . Number of children: 0  . Years of education: Associates  . Highest education level: Not on file  Occupational History  . Occupation: Biochemist, clinical- xray tech  Tobacco Use  . Smoking status: Never Smoker  . Smokeless tobacco: Never Used  Vaping Use  . Vaping Use: Never used  Substance and Sexual Activity  . Alcohol use: Yes    Alcohol/week: 2.0 standard drinks    Types: 2 Standard drinks or equivalent per week  . Drug use: No  . Sexual activity: Yes    Partners: Male    Birth control/protection: Inserts    Comment: nuvaring  Other Topics Concern  .  Not on file  Social History Narrative   Lives alone   Caffeine use: 1 cup daily   Right-handed   Social Determinants of Health   Financial Resource Strain: Not on file  Food Insecurity: Not on file  Transportation Needs: Not on file  Physical Activity: Not on file  Stress: Not on file  Social Connections: Not on file  Intimate Partner Violence: Not on file    Family History  Problem Relation Age of Onset  . Hyperlipidemia Father   . Hypertension Father        ?  Marland Kitchen Heart disease Paternal Grandfather   . Depression Maternal Uncle   . Lung cancer Maternal Grandfather      Immunization History  Administered Date(s) Administered  . Influenza-Unspecified 02/08/2020  . PFIZER(Purple Top)SARS-COV-2 Vaccination 01/28/2019, 02/16/2019, 10/28/2019  . Tdap 01/15/2007, 08/06/2017    Outpatient Encounter Medications as of 03/03/2020  Medication Sig  . dicyclomine (BENTYL) 20 MG tablet Take 1 tablet (20 mg total) by mouth 3 (three) times daily before meals.  . Probiotic Product (PROBIOTIC-10 PO) Take by mouth.  . Vitamin D, Ergocalciferol, (DRISDOL) 1.25 MG (50000 UNIT) CAPS capsule Take 1 capsule (50,000 Units total) by mouth every  7 (seven) days.  . [DISCONTINUED] ALPRAZolam (XANAX) 0.5 MG tablet 0.5-1.0 tab po 30-60 min prior to dental procedure  . ALPRAZolam (XANAX) 0.5 MG tablet 0.5-1.0 tab po 30-60 min prior to dental procedure   No facility-administered encounter medications on file as of 03/03/2020.     ROS: Gen: no fever, chills  Skin: no rash, itching ENT: no ear pain, ear drainage, nasal congestion, rhinorrhea, sinus pressure, sore throat Eyes: no blurry vision, double vision Resp: no cough, wheeze,SOB Breast: no breast tenderness, no nipple discharge, no breast masses CV: no CP, palpitations, LE edema,  GI: no heartburn, n/v/d/c, abd pain GU: no dysuria, urgency, frequency, hematuria MSK: no joint pain, myalgias, back pain Neuro: no dizziness, headache, weakness,  vertigo Psych: no depression, anxiety, insomnia   Allergies  Allergen Reactions  . [name] [Drospirenone-Ethinyl Estradiol] Rash    Depression screen [facility] 2/9 01/20/2020 10/29/2019  Decreased Interest 1 0  Down, Depressed, Hopeless 2 0  PHQ - 2 Score 3 0  Altered sleeping 2 -  Tired, decreased energy 3 -  Change in appetite 3 -  Feeling bad or failure about yourself  2 -  Trouble concentrating 2 -  Moving slowly or fidgety/restless 1 -  Suicidal thoughts 0 -  PHQ-9 Score 16 -   No flowsheet data found.  BP 112/74   Pulse 68   Temp 98.3 F (36.8 C) (Temporal)   Ht 5\' 3"  (1.6 m)   Wt 204 lb 12.8 oz (92.9 kg)   LMP 02/12/2020   SpO2 97%   BMI 36.28 kg/m   Wt Readings from Last 3 Encounters:  03/03/20 204 lb 12.8 oz (92.9 kg)  02/21/20 202 lb (91.6 kg)  02/03/20 208 lb (94.3 kg)   Temp Readings from Last 3 Encounters:  03/03/20 98.3 F (36.8 C) (Temporal)  02/21/20 97.6 F (36.4 C)  02/03/20 98 F (36.7 C)   BP Readings from Last 3 Encounters:  03/03/20 112/74  02/21/20 110/74  02/03/20 105/69   Pulse Readings from Last 3 Encounters:  03/03/20 68  02/21/20 70  02/03/20 61     Physical Exam Constitutional:      General: She is not in acute distress.    Appearance: She is well-developed and well-nourished.  HENT:     Head: Normocephalic and atraumatic.     Right Ear: Tympanic membrane and ear canal normal.     Left Ear: Tympanic membrane and ear canal normal.     Nose: Nose normal.     Mouth/Throat:     Mouth: Oropharynx is clear and moist and mucous membranes are normal. Mucous membranes are moist.     Pharynx: Oropharynx is clear.  Eyes:     Conjunctiva/sclera: Conjunctivae normal.  Neck:     Thyroid: No thyromegaly.  Cardiovascular:     Rate and Rhythm: Normal rate and regular rhythm.     Pulses: Intact distal pulses.     Heart sounds: Normal heart sounds. No murmur heard.   Pulmonary:     Effort: Pulmonary effort is normal. No respiratory  distress.     Breath sounds: Normal breath sounds. No wheezing or rhonchi.  Abdominal:     General: Bowel sounds are normal. There is no distension.     Palpations: Abdomen is soft. There is no mass.     Tenderness: There is no abdominal tenderness.  Musculoskeletal:        General: No edema.     Cervical back: Neck supple.  Right lower leg: No edema.     Left lower leg: No edema.  Lymphadenopathy:     Cervical: No cervical adenopathy.  Skin:    General: Skin is warm and dry.  Neurological:     Mental Status: She is alert and oriented to person, place, and time.     Motor: No abnormal muscle tone.     Coordination: Coordination normal.  Psychiatric:        Mood and Affect: Mood and affect and mood normal.        Behavior: Behavior normal.      A/P:  1. Annual physical exam - discussed importance of regular CV exercise, healthy diet, adequate sleep - pt has lost almost 15lbs with increased exercise and improved diet! - PAP UTD - follows with GYN Dr. Lynnette Caffey - immunizations UTD - dental and vision exams UTD - labs done in 01/2020 - next CPE in 1 year  2. Vitamin D deficiency - on 50,000IU weekly at this time  3. Other hyperlipidemia - not on statin - pt is working on weight loss and healthy diet and is doing very well with this - recheck in 6-20mo  4. Insulin resistance - pt is working on weight loss and healthy diet and is doing very well with this - follows with [facility]  5. Other irritable bowel syndrome - takes bentyl PRN but states symptoms have been well-controlled espec in past couple months and does not take med often  6. Anxiety about health - takes prior to dental appts - database reviewed and appropriate Refill: - ALPRAZolam (XANAX) 0.5 MG tablet; 0.5-1.0 tab po 30-60 min prior to dental procedure  Dispense: 20 tablet; Refill: 0    This visit occurred during the SARS-CoV-2 public health emergency.  Safety protocols were in place,  including screening questions prior to the visit, additional usage of staff PPE, and extensive cleaning of exam room while observing appropriate contact time as indicated for disinfecting solutions.

## 2020-03-13 ENCOUNTER — Encounter: Payer: Self-pay | Admitting: Family Medicine

## 2020-03-16 ENCOUNTER — Ambulatory Visit (INDEPENDENT_AMBULATORY_CARE_PROVIDER_SITE_OTHER): Payer: 59 | Admitting: Adult Health

## 2020-04-07 ENCOUNTER — Other Ambulatory Visit (HOSPITAL_COMMUNITY): Payer: Self-pay | Admitting: Obstetrics & Gynecology

## 2020-04-07 DIAGNOSIS — N911 Secondary amenorrhea: Secondary | ICD-10-CM | POA: Diagnosis not present

## 2020-04-12 ENCOUNTER — Other Ambulatory Visit (HOSPITAL_COMMUNITY): Payer: Self-pay | Admitting: Obstetrics & Gynecology

## 2020-04-13 DIAGNOSIS — Z3481 Encounter for supervision of other normal pregnancy, first trimester: Secondary | ICD-10-CM | POA: Diagnosis not present

## 2020-04-13 DIAGNOSIS — Z3685 Encounter for antenatal screening for Streptococcus B: Secondary | ICD-10-CM | POA: Diagnosis not present

## 2020-04-13 LAB — OB RESULTS CONSOLE HIV ANTIBODY (ROUTINE TESTING): HIV: NONREACTIVE

## 2020-04-13 LAB — HEPATITIS C ANTIBODY: HCV Ab: NEGATIVE

## 2020-04-13 LAB — OB RESULTS CONSOLE ABO/RH: RH Type: POSITIVE

## 2020-04-13 LAB — OB RESULTS CONSOLE HEPATITIS B SURFACE ANTIGEN: Hepatitis B Surface Ag: NEGATIVE

## 2020-04-13 LAB — OB RESULTS CONSOLE GC/CHLAMYDIA
Chlamydia: NEGATIVE
Gonorrhea: NEGATIVE

## 2020-04-13 LAB — OB RESULTS CONSOLE ANTIBODY SCREEN: Antibody Screen: NEGATIVE

## 2020-04-13 LAB — OB RESULTS CONSOLE RUBELLA ANTIBODY, IGM: Rubella: IMMUNE

## 2020-04-13 LAB — OB RESULTS CONSOLE RPR: RPR: NONREACTIVE

## 2020-05-04 DIAGNOSIS — Z3A11 11 weeks gestation of pregnancy: Secondary | ICD-10-CM | POA: Diagnosis not present

## 2020-05-04 DIAGNOSIS — Z113 Encounter for screening for infections with a predominantly sexual mode of transmission: Secondary | ICD-10-CM | POA: Diagnosis not present

## 2020-05-04 DIAGNOSIS — Z3481 Encounter for supervision of other normal pregnancy, first trimester: Secondary | ICD-10-CM | POA: Diagnosis not present

## 2020-05-08 ENCOUNTER — Other Ambulatory Visit (HOSPITAL_COMMUNITY): Payer: Self-pay

## 2020-05-08 MED ORDER — METRONIDAZOLE 0.75 % VA GEL
VAGINAL | 0 refills | Status: DC
  Filled 2020-05-08: qty 70, 5d supply, fill #0

## 2020-05-11 DIAGNOSIS — Z3682 Encounter for antenatal screening for nuchal translucency: Secondary | ICD-10-CM | POA: Diagnosis not present

## 2020-05-11 DIAGNOSIS — Z3481 Encounter for supervision of other normal pregnancy, first trimester: Secondary | ICD-10-CM | POA: Diagnosis not present

## 2020-05-11 DIAGNOSIS — Z3A12 12 weeks gestation of pregnancy: Secondary | ICD-10-CM | POA: Diagnosis not present

## 2020-06-14 ENCOUNTER — Encounter: Payer: Self-pay | Admitting: Family Medicine

## 2020-06-14 DIAGNOSIS — Z1283 Encounter for screening for malignant neoplasm of skin: Secondary | ICD-10-CM

## 2020-06-29 DIAGNOSIS — Z363 Encounter for antenatal screening for malformations: Secondary | ICD-10-CM | POA: Diagnosis not present

## 2020-06-29 DIAGNOSIS — Z3400 Encounter for supervision of normal first pregnancy, unspecified trimester: Secondary | ICD-10-CM | POA: Diagnosis not present

## 2020-06-29 DIAGNOSIS — Z3A19 19 weeks gestation of pregnancy: Secondary | ICD-10-CM | POA: Diagnosis not present

## 2020-07-27 DIAGNOSIS — Z362 Encounter for other antenatal screening follow-up: Secondary | ICD-10-CM | POA: Diagnosis not present

## 2020-07-27 DIAGNOSIS — Z3A23 23 weeks gestation of pregnancy: Secondary | ICD-10-CM | POA: Diagnosis not present

## 2020-07-27 DIAGNOSIS — Z3400 Encounter for supervision of normal first pregnancy, unspecified trimester: Secondary | ICD-10-CM | POA: Diagnosis not present

## 2020-07-31 ENCOUNTER — Other Ambulatory Visit: Payer: Self-pay | Admitting: Obstetrics and Gynecology

## 2020-07-31 DIAGNOSIS — O283 Abnormal ultrasonic finding on antenatal screening of mother: Secondary | ICD-10-CM

## 2020-07-31 DIAGNOSIS — Z363 Encounter for antenatal screening for malformations: Secondary | ICD-10-CM

## 2020-08-01 ENCOUNTER — Other Ambulatory Visit: Payer: Self-pay

## 2020-08-07 ENCOUNTER — Encounter: Payer: Self-pay | Admitting: *Deleted

## 2020-08-08 ENCOUNTER — Encounter: Payer: Self-pay | Admitting: *Deleted

## 2020-08-08 ENCOUNTER — Ambulatory Visit: Payer: 59 | Attending: Obstetrics and Gynecology | Admitting: *Deleted

## 2020-08-08 ENCOUNTER — Other Ambulatory Visit: Payer: Self-pay

## 2020-08-08 ENCOUNTER — Ambulatory Visit: Payer: 59

## 2020-08-08 ENCOUNTER — Ambulatory Visit (HOSPITAL_BASED_OUTPATIENT_CLINIC_OR_DEPARTMENT_OTHER): Payer: 59

## 2020-08-08 DIAGNOSIS — Z3A25 25 weeks gestation of pregnancy: Secondary | ICD-10-CM | POA: Insufficient documentation

## 2020-08-08 DIAGNOSIS — Z363 Encounter for antenatal screening for malformations: Secondary | ICD-10-CM | POA: Insufficient documentation

## 2020-08-08 DIAGNOSIS — O283 Abnormal ultrasonic finding on antenatal screening of mother: Secondary | ICD-10-CM

## 2020-08-09 ENCOUNTER — Other Ambulatory Visit (HOSPITAL_COMMUNITY): Payer: Self-pay

## 2020-08-09 DIAGNOSIS — D225 Melanocytic nevi of trunk: Secondary | ICD-10-CM | POA: Diagnosis not present

## 2020-08-09 DIAGNOSIS — L738 Other specified follicular disorders: Secondary | ICD-10-CM | POA: Diagnosis not present

## 2020-08-09 DIAGNOSIS — L7211 Pilar cyst: Secondary | ICD-10-CM | POA: Diagnosis not present

## 2020-08-09 DIAGNOSIS — L218 Other seborrheic dermatitis: Secondary | ICD-10-CM | POA: Diagnosis not present

## 2020-08-09 DIAGNOSIS — L814 Other melanin hyperpigmentation: Secondary | ICD-10-CM | POA: Diagnosis not present

## 2020-08-09 MED ORDER — TRIAMCINOLONE ACETONIDE 0.025 % EX CREA
TOPICAL_CREAM | CUTANEOUS | 2 refills | Status: DC
  Filled 2020-08-09: qty 15, 10d supply, fill #0

## 2020-08-09 MED ORDER — KETOCONAZOLE 2 % EX SHAM
MEDICATED_SHAMPOO | CUTANEOUS | 2 refills | Status: DC
  Filled 2020-08-09: qty 120, 30d supply, fill #0

## 2020-08-29 DIAGNOSIS — Z3480 Encounter for supervision of other normal pregnancy, unspecified trimester: Secondary | ICD-10-CM | POA: Diagnosis not present

## 2020-08-29 DIAGNOSIS — Z3400 Encounter for supervision of normal first pregnancy, unspecified trimester: Secondary | ICD-10-CM | POA: Diagnosis not present

## 2020-08-29 DIAGNOSIS — Z23 Encounter for immunization: Secondary | ICD-10-CM | POA: Diagnosis not present

## 2020-08-30 ENCOUNTER — Ambulatory Visit: Payer: 59

## 2020-09-01 ENCOUNTER — Other Ambulatory Visit: Payer: Self-pay

## 2020-09-01 ENCOUNTER — Inpatient Hospital Stay (HOSPITAL_COMMUNITY)
Admission: AD | Admit: 2020-09-01 | Discharge: 2020-09-02 | Disposition: A | Discharge status: Home / Self Care | Payer: 59 | Attending: Obstetrics and Gynecology | Admitting: Obstetrics and Gynecology

## 2020-09-01 ENCOUNTER — Encounter (HOSPITAL_COMMUNITY): Payer: Self-pay | Admitting: Obstetrics and Gynecology

## 2020-09-01 DIAGNOSIS — Z3689 Encounter for other specified antenatal screening: Secondary | ICD-10-CM | POA: Insufficient documentation

## 2020-09-01 DIAGNOSIS — K219 Gastro-esophageal reflux disease without esophagitis: Secondary | ICD-10-CM | POA: Diagnosis not present

## 2020-09-01 DIAGNOSIS — Z79899 Other long term (current) drug therapy: Secondary | ICD-10-CM | POA: Insufficient documentation

## 2020-09-01 DIAGNOSIS — O26893 Other specified pregnancy related conditions, third trimester: Secondary | ICD-10-CM | POA: Insufficient documentation

## 2020-09-01 DIAGNOSIS — O99613 Diseases of the digestive system complicating pregnancy, third trimester: Secondary | ICD-10-CM | POA: Diagnosis not present

## 2020-09-01 DIAGNOSIS — Z8249 Family history of ischemic heart disease and other diseases of the circulatory system: Secondary | ICD-10-CM | POA: Insufficient documentation

## 2020-09-01 DIAGNOSIS — Z3A28 28 weeks gestation of pregnancy: Secondary | ICD-10-CM | POA: Diagnosis not present

## 2020-09-01 DIAGNOSIS — N644 Mastodynia: Secondary | ICD-10-CM | POA: Diagnosis not present

## 2020-09-01 DIAGNOSIS — R0789 Other chest pain: Secondary | ICD-10-CM | POA: Insufficient documentation

## 2020-09-01 DIAGNOSIS — M25519 Pain in unspecified shoulder: Secondary | ICD-10-CM | POA: Diagnosis not present

## 2020-09-01 HISTORY — DX: Gastro-esophageal reflux disease without esophagitis: K21.9

## 2020-09-01 HISTORY — DX: Unspecified asthma, uncomplicated: J45.909

## 2020-09-01 LAB — URINALYSIS, ROUTINE W REFLEX MICROSCOPIC
Bilirubin Urine: NEGATIVE
Glucose, UA: NEGATIVE mg/dL
Hgb urine dipstick: NEGATIVE
Ketones, ur: NEGATIVE mg/dL
Leukocytes,Ua: NEGATIVE
Nitrite: NEGATIVE
Protein, ur: NEGATIVE mg/dL
Specific Gravity, Urine: 1.002 — ABNORMAL LOW (ref 1.005–1.030)
pH: 8 (ref 5.0–8.0)

## 2020-09-01 LAB — CBC
HCT: 33.5 % — ABNORMAL LOW (ref 36.0–46.0)
Hemoglobin: 11.4 g/dL — ABNORMAL LOW (ref 12.0–15.0)
MCH: 28.9 pg (ref 26.0–34.0)
MCHC: 34 g/dL (ref 30.0–36.0)
MCV: 85 fL (ref 80.0–100.0)
Platelets: 168 10*3/uL (ref 150–400)
RBC: 3.94 MIL/uL (ref 3.87–5.11)
RDW: 12.8 % (ref 11.5–15.5)
WBC: 11.9 10*3/uL — ABNORMAL HIGH (ref 4.0–10.5)
nRBC: 0 % (ref 0.0–0.2)

## 2020-09-01 MED ORDER — FAMOTIDINE IN NACL 20-0.9 MG/50ML-% IV SOLN
20.0000 mg | Freq: Once | INTRAVENOUS | Status: AC
  Administered 2020-09-01: 20 mg via INTRAVENOUS
  Filled 2020-09-01: qty 50

## 2020-09-01 MED ORDER — ALUM & MAG HYDROXIDE-SIMETH 200-200-20 MG/5ML PO SUSP
30.0000 mL | Freq: Once | ORAL | Status: AC
  Administered 2020-09-01: 30 mL via ORAL
  Filled 2020-09-01: qty 30

## 2020-09-01 MED ORDER — LACTATED RINGERS IV SOLN
Freq: Once | INTRAVENOUS | Status: AC
  Administered 2020-09-01: 1000 mL via INTRAVENOUS

## 2020-09-01 NOTE — MAU Provider Note (Addendum)
History     CSN: [id number]  Arrival date Jenna Lane: 09/01/20 2226   Event Date/Lane   First Provider Initiated Contact with Patient 09/01/20 2300      Chief Complaint  Patient presents with   Shoulder Pain   Breast Pain   HPI Jenna Lane is a 30 y.o. G1P0000 at 43w4dwho presents to MAU with chief complaint of left upper chest pain behind the lobe of her breast as well as pain near her scapula.  These are new problems, onset this afternoon. Pain is intermittent Jenna resolved prior to patient arriving to MAU. Patient states Jenna assumed the pain might be related to poor diet choices; Jenna had a philly cheesesteak Jenna greek salad for lunch around 1pm, but Jenna states her dinner was a cheese quesadilla. Jenna does not have access to GERD medication but her symptoms gradually improved with rest.  Patient denies substernal chest pain, palpitations, weakness or syncope. Jenna denies personal or family history of acute cardiac events. Jenna denies vaginal bleeding, leaking of fluid, decreased fetal movement, fever, falls, or recent illness.   Patient receives care with [facility].  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   Heart disease Paternal Grandfather    Depression Maternal Uncle    Lung cancer Maternal Grandfather     Social History   Tobacco Use   Smoking status: Never   Smokeless tobacco: Never  Vaping Use   Vaping Use: Never used  Substance Use Topics   Alcohol use: Not Currently    Alcohol/week: 2.0 standard drinks    Types: 2 Standard drinks or equivalent per week   Drug use: No    Allergies:  Allergies  Allergen Reactions   [name] [Drospirenone-Ethinyl Estradiol] Rash    Medications Prior to Admission  Medication Sig Dispense Refill Last Dose   doxylamine, Sleep, (UNISOM) 25 MG tablet Take 25 mg by mouth at bedtime as needed.   08/31/2020   Prenatal Vit-Fe Fumarate-FA (PRENATAL VITAMINS PO) Take by mouth.   08/31/2020  Probiotic Product (PROBIOTIC-10 PO) Take by mouth.   Past Week   dicyclomine (BENTYL) 20 MG tablet Take 1 tablet (20 mg total) by mouth 3 (three) times daily before meals. (Patient not taking: Reported on 08/08/2020) 270 tablet 1    ketoconazole (NIZORAL) 2 % shampoo Wash scalp as directed.  Leave in 5-10 minutes before rinsing 120 mL 2    triamcinolone (KENALOG) 0.025 % cream Apply to ears daily for 5-7 days as needed for flares 15 g 2    Vitamin D, Ergocalciferol, (DRISDOL) 1.25 MG (50000 UNIT) CAPS capsule TAKE 1 CAPSULE BY MOUTH EVERY 7 DAYS. 4 capsule 0     Review of Systems  Cardiovascular:        LUQ of chest proximal to axilla  All other systems reviewed Jenna are negative.   Physical Exam   Blood pressure 134/76, pulse (!) 101, temperature 97.9 F (36.6 C), temperature source Oral, resp. rate 16, height '5\' 3"'$  (1.6 m), weight 102.8 kg, last menstrual period 02/15/2020, SpO2 99 %.  Physical Exam Vitals Jenna nursing note reviewed. Exam conducted with a chaperone  present.  Constitutional:      Appearance: Normal appearance.  Cardiovascular:     Rate Jenna Rhythm: Normal rate Jenna regular rhythm.     Pulses: Normal pulses.     Heart sounds: Normal heart sounds.  Pulmonary:     Breath sounds: Normal breath sounds.  Abdominal:     Comments: Gravid  Skin:    Capillary Refill: Capillary refill takes less than 2 seconds.  Neurological:     Mental Status: Jenna Lane, Jenna Lane, Jenna Lane.  Psychiatric:        Mood Jenna Affect: Mood normal.        Behavior: Behavior normal.        Thought Content: Thought content normal.        Judgment: Judgment normal.   Physical exam negative for TTP,  No lesions or evidence of trauma noted on left chest wall or breast at or near locus of pain  MAU Course  Procedures  --One brief episode of discomfort during period of evaluation in MAU --ECG: NSR with sinus arrhythmia noted --Attempted Cardiology consult prior to MAU discharge. No response to page x 3.  --Pertinent negatives: chest pain, palpitations, altered vital signs,increased work of breathing, abnormal ECG --Urgent intervention not indicated at this Lane. Will forward chart to Dr. Harriet Masson from [facility] Cardiology team for recommendations on additional outpatient surveillance. Pt agreeable to this plan --Reactive tracing: baseline 155, mod var, + accels, no decels --Toco: quiet  Patient Vitals for the past 24 hrs:  BP Temp Temp src Pulse Resp SpO2 Height Weight  09/02/20 0147 118/66 97.6 F (36.4 C) Oral 73 17 100 % -- --  09/02/20 0145 -- -- -- -- -- 99 % -- --  09/02/20 0143 118/66 -- -- 73 17 -- -- --  09/02/20 0001 126/71 -- -- 90 -- -- -- --  09/01/20 2346 134/84 -- -- 95 -- -- -- --  09/01/20 2331 (!) 149/78 -- -- 84 -- -- -- --  09/01/20 2254 131/77 -- -- 81 -- -- -- --  09/01/20 2239 134/76 97.9 F (36.6 C) Oral (!) 101 16 99 % '5\' 3"'$  (1.6 m) 102.8 kg   Results for orders placed or performed during the hospital encounter of  09/01/20 (from the past 24 hour(s))  Urinalysis, Routine w reflex microscopic Urine, Clean Catch  Status: Abnormal   Collection Lane: 09/01/20 10:44 PM  Result Value Ref Range   Color, Urine STRAW (A) YELLOW   APPearance CLEAR CLEAR   Specific Gravity, Urine 1.002 (L) 1.005 - 1.030   pH 8.0 5.0 - 8.0   Glucose, UA NEGATIVE NEGATIVE mg/dL   Hgb urine dipstick NEGATIVE NEGATIVE   Bilirubin Urine NEGATIVE NEGATIVE   Ketones, ur NEGATIVE NEGATIVE mg/dL   Protein, ur NEGATIVE NEGATIVE mg/dL   Nitrite NEGATIVE NEGATIVE   Leukocytes,Ua NEGATIVE NEGATIVE  CBC     Status: Abnormal   Collection Lane: 09/01/20 11:17 PM  Result Value Ref Range   WBC 11.9 (H) 4.0 - 10.5 K/uL   RBC 3.94 3.87 - 5.11 MIL/uL   Hemoglobin 11.4 (L) 12.0 - 15.0 g/dL   HCT 33.5 (L) 36.0 - 46.0 %   MCV 85.0 80.0 - 100.0 fL   MCH 28.9 26.0 - 34.0 pg   MCHC 34.0 30.0 - 36.0 g/dL   RDW 12.8 11.5 - 15.5 %   Platelets 168 150 - 400 K/uL   nRBC 0.0 0.0 - 0.2 %  Troponin I (High Sensitivity)     Status: None   Collection Lane: 09/01/20 11:17 PM  Result Value Ref Range   Troponin I (High Sensitivity) 5 <18 ng/L  Comprehensive metabolic panel     Status: Abnormal   Collection Lane: 09/01/20 11:17 PM  Result Value Ref Range   Sodium 136 135 - 145 mmol/L   Potassium 3.3 (L) 3.5 - 5.1 mmol/L   Chloride 106 98 - 111 mmol/L   CO2 21 (L) 22 - 32 mmol/L   Glucose, Bld 93 70 - 99 mg/dL   BUN <5 (L) 6 - 20 mg/dL   Creatinine, Ser 0.47 0.44 - 1.00 mg/dL   Calcium 9.3 8.9 - 10.3 mg/dL   Total Protein 5.7 (L) 6.5 - 8.1 g/dL   Albumin 2.8 (L) 3.5 - 5.0 g/dL   AST 17 15 - 41 U/L   ALT 14 0 - 44 U/L   Alkaline Phosphatase 73 38 - 126 U/L   Total Bilirubin 0.4 0.3 - 1.2 mg/dL   GFR, Estimated >60 >60 mL/min   Anion gap 9 5 - 15   Meds ordered this encounter  Medications   famotidine (PEPCID) IVPB 20 mg premix   lactated ringers infusion   alum & mag hydroxide-simeth (MAALOX/MYLANTA) 200-200-20 MG/5ML suspension 30 mL    omeprazole (PRILOSEC) 20 MG capsule    Sig: Take 1 capsule (20 mg total) by mouth daily.    Dispense:  30 capsule    Refill:  0    Order Specific Question:   Supervising Provider    Answer:   Janyth Pupa [id number]   Assessment Jenna Plan  --30 y.o. G1P0000 at [redacted]w[redacted]d --Reactive tracing --Pain in left upper chest, axilla --Suspicion for GERD, outpatient regimen initiated --Elevated BP x 1 without diagnosis of Hypertension --Discharge home in stable condition  SDarlina Rumpf CNM 09/01/2020, 5:59 AM

## 2020-09-01 NOTE — MAU Note (Signed)
Pt c/o of left breast and left scapula pain since this afternoon. Has been watching what she ate today to see if that was the cause but symptoms didn't subside. +FM, no LOF or bleeding.

## 2020-09-01 NOTE — MAU Note (Signed)
Pt reports pain has resolved. CNM at bedside

## 2020-09-01 NOTE — MAU Note (Addendum)
Pt reports she is beginning to feel the pain in her L upper chest above her breast-rates pain at 1-2 on 0-10 scale and says its a dull pain. CNM notified. Labs pending

## 2020-09-02 ENCOUNTER — Telehealth: Payer: Self-pay | Admitting: Advanced Practice Midwife

## 2020-09-02 ENCOUNTER — Other Ambulatory Visit (HOSPITAL_COMMUNITY): Payer: Self-pay

## 2020-09-02 LAB — COMPREHENSIVE METABOLIC PANEL
ALT: 14 U/L (ref 0–44)
AST: 17 U/L (ref 15–41)
Albumin: 2.8 g/dL — ABNORMAL LOW (ref 3.5–5.0)
Alkaline Phosphatase: 73 U/L (ref 38–126)
Anion gap: 9 (ref 5–15)
BUN: <5 mg/dL — ABNORMAL LOW (ref 6–20)
CO2: 21 mmol/L — ABNORMAL LOW (ref 22–32)
Calcium: 9.3 mg/dL (ref 8.9–10.3)
Chloride: 106 mmol/L (ref 98–111)
Creatinine, Ser: 0.47 mg/dL (ref 0.44–1.00)
GFR, Estimated: >60 mL/min (ref >60)
Glucose, Bld: 93 mg/dL (ref 70–99)
Potassium: 3.3 mmol/L — ABNORMAL LOW (ref 3.5–5.1)
Sodium: 136 mmol/L (ref 135–145)
Total Bilirubin: 0.4 mg/dL (ref 0.3–1.2)
Total Protein: 5.7 g/dL — ABNORMAL LOW (ref 6.5–8.1)

## 2020-09-02 LAB — TROPONIN I (HIGH SENSITIVITY): Troponin I (High Sensitivity): 5 ng/L (ref <18)

## 2020-09-02 MED ORDER — OMEPRAZOLE 20 MG PO CPDR
20.0000 mg | DELAYED_RELEASE_CAPSULE | Freq: Every day | ORAL | 0 refills | Status: DC
  Filled 2020-09-02: qty 30, 30d supply, fill #0

## 2020-09-02 NOTE — MAU Note (Signed)
Pt up to bathroom.

## 2020-09-02 NOTE — MAU Note (Signed)
Pt reports pain spontaneously resolved. Pain 0 on 0-10 pain scale

## 2020-09-02 NOTE — Telephone Encounter (Signed)
Patient called at home. Identity confirmed with identifier x 2. Discussed input from Dr. Berniece Salines, to whom I forwarded patient's chart including last night's MAU encounter. Dr. Harriet Masson agrees with my assessment that discomfort is not associated with Cards-related concern, most likely GERD. No further indication for Cardiology involvement at this time. Pt verbalized understanding, expressed appreciation for phone call. Denies questions, complaints, concerns at this time.  Mallie Snooks, MSN, CNM Certified Nurse Midwife, [employer] for [employer] [employer] 09/02/20 8:21 PM

## 2020-09-04 DIAGNOSIS — O99810 Abnormal glucose complicating pregnancy: Secondary | ICD-10-CM | POA: Diagnosis not present

## 2020-09-06 ENCOUNTER — Other Ambulatory Visit (HOSPITAL_COMMUNITY): Payer: Self-pay

## 2020-09-06 MED ORDER — METRONIDAZOLE 0.75 % VA GEL
VAGINAL | 5 refills | Status: DC
  Filled 2020-09-06: qty 70, 5d supply, fill #0

## 2020-10-25 DIAGNOSIS — Z3A36 36 weeks gestation of pregnancy: Secondary | ICD-10-CM | POA: Diagnosis not present

## 2020-10-25 DIAGNOSIS — Z3685 Encounter for antenatal screening for Streptococcus B: Secondary | ICD-10-CM | POA: Diagnosis not present

## 2020-10-25 DIAGNOSIS — O3663X0 Maternal care for excessive fetal growth, third trimester, not applicable or unspecified: Secondary | ICD-10-CM | POA: Diagnosis not present

## 2020-10-25 DIAGNOSIS — Z3400 Encounter for supervision of normal first pregnancy, unspecified trimester: Secondary | ICD-10-CM | POA: Diagnosis not present

## 2020-10-25 LAB — OB RESULTS CONSOLE GBS: GBS: NEGATIVE

## 2020-11-03 ENCOUNTER — Telehealth (HOSPITAL_COMMUNITY): Payer: Self-pay | Admitting: *Deleted

## 2020-11-03 NOTE — Telephone Encounter (Signed)
Preadmission screen  

## 2020-11-03 NOTE — Patient Instructions (Signed)
Jakiera Ehler  11/03/2020   Your procedure is scheduled on:  11/15/2020  Arrive at 10 at Entrance C on [address] at [facility]  and [address]. You are invited to use the FREE valet parking or use the Visitor's parking deck.  Pick up the phone at the desk and dial [phone].  Call this number if you have problems the morning of surgery: [phone]  Remember:   Do not eat food:(After Midnight) Desps de medianoche.  Do not drink clear liquids: (After Midnight) Desps de medianoche.  Take these medicines the morning of surgery with A SIP OF WATER:  none   Do not wear jewelry, make-up or nail polish.  Do not wear lotions, powders, or perfumes. Do not wear deodorant.  Do not shave 48 hours prior to surgery.  Do not bring valuables to the hospital.  [facility] is not   responsible for any belongings or valuables brought to the hospital.  Contacts, dentures or bridgework may not be worn into surgery.  Leave suitcase in the car. After surgery it may be brought to your room.  For patients admitted to the hospital, checkout time is 11:00 AM the day of              discharge.      Please read over the following fact sheets that you were given:     Preparing for Surgery

## 2020-11-07 ENCOUNTER — Encounter (HOSPITAL_COMMUNITY): Payer: Self-pay

## 2020-11-07 ENCOUNTER — Telehealth (HOSPITAL_COMMUNITY): Payer: Self-pay | Admitting: *Deleted

## 2020-11-07 NOTE — Telephone Encounter (Signed)
Preadmission screen  

## 2020-11-08 DIAGNOSIS — O3663X0 Maternal care for excessive fetal growth, third trimester, not applicable or unspecified: Secondary | ICD-10-CM | POA: Diagnosis not present

## 2020-11-08 DIAGNOSIS — Z3A38 38 weeks gestation of pregnancy: Secondary | ICD-10-CM | POA: Diagnosis not present

## 2020-11-08 DIAGNOSIS — Z3403 Encounter for supervision of normal first pregnancy, third trimester: Secondary | ICD-10-CM | POA: Diagnosis not present

## 2020-11-08 NOTE — H&P (Signed)
Jenna Lane is a 30 y.o. female presenting for Primary elective cesarean section. Last US showed EFW = 3780g(8'5oz) = 89%, AC > 97%, AFI = 19.8cm = 78%. 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 This is a SURPRISE gender.   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   IBS (irritable bowel syndrome)    MRSA infection 2009   on skin   Right ankle injury    Vitamin D deficiency    Past Surgical History:  Procedure Laterality Date   COLPOSCOPY  2011   skin cyst excision     left axillary   TONSILLECTOMY AND ADENOIDECTOMY N/A 01/02/2013   Procedure: TONSILLECTOMY ;  Surgeon: Jodi Marble, MD;  [facility];  Service: ENT;  Laterality: N/A;   WISDOM TOOTH EXTRACTION     Family History: family history includes Depression in her maternal uncle; Heart disease in her paternal grandfather; Hyperlipidemia in her father; Hypertension in her father; Lung cancer in her maternal grandfather. Social History:  reports that she has never smoked. She has never used smokeless tobacco. She reports that she does not currently use alcohol after a past usage of about 2.0 standard drinks per week. She reports that she does not use drugs.     Maternal Diabetes: No Genetic Screening: Normal Maternal Ultrasounds/Referrals: Normal Fetal Ultrasounds or other Referrals:  None Maternal Substance Abuse:  No Significant Maternal Medications:  None Significant Maternal Lab Results:  None Other Comments:  None  Review of Systems History   Last menstrual period  02/15/2020. Exam Physical Exam (from office) NAD, A&O NWOB Abd soft, nondistended, gravid  Prenatal labs: ABO, Rh: B/Positive/-- (03/31 0000) Antibody: Negative (03/31 0000) Rubella: Immune (03/31 0000) RPR: Nonreactive (03/31 0000)  HBsAg: Negative (03/31 0000)  HIV: Non-reactive (03/31 0000)  GBS:     Assessment/Plan:  30yo G1P0 presenting for scheduled primary elective CS. D/w pt vaginal delivery vs primary elective CS. Both benefits and risks reviewed of each. Risks of vaginal delivery reviewed such as failure and the need for CS. CS risks reviewed including infxn, bleeding, damage to surrounding structure, need for additional procedures, possibility of placental abnormalities and need for CS in the future. If chooses CS and becomes pregnant again, we discussed risk of uterine rupture with 10% chance of neonatal morbidity/mortality. Her ideal family size is 1 or 2. After careful consideration, and long conversation, she elects to have a primary elective C section.     Tyson Dense 11/08/2020, 1:51 PM

## 2020-11-13 ENCOUNTER — Other Ambulatory Visit: Payer: Self-pay

## 2020-11-13 ENCOUNTER — Encounter (HOSPITAL_COMMUNITY)
Admission: RE | Admit: 2020-11-13 | Discharge: 2020-11-13 | Disposition: A | Discharge status: Home / Self Care | Payer: 59 | Source: Ambulatory Visit | Attending: Obstetrics and Gynecology | Admitting: Obstetrics and Gynecology

## 2020-11-13 ENCOUNTER — Other Ambulatory Visit: Payer: Self-pay | Admitting: Obstetrics and Gynecology

## 2020-11-13 DIAGNOSIS — Z01812 Encounter for preprocedural laboratory examination: Secondary | ICD-10-CM | POA: Insufficient documentation

## 2020-11-13 DIAGNOSIS — Z23 Encounter for immunization: Secondary | ICD-10-CM | POA: Diagnosis not present

## 2020-11-13 DIAGNOSIS — O3663X0 Maternal care for excessive fetal growth, third trimester, not applicable or unspecified: Secondary | ICD-10-CM | POA: Diagnosis not present

## 2020-11-13 DIAGNOSIS — D62 Acute posthemorrhagic anemia: Secondary | ICD-10-CM | POA: Diagnosis not present

## 2020-11-13 DIAGNOSIS — Z3A39 39 weeks gestation of pregnancy: Secondary | ICD-10-CM | POA: Diagnosis not present

## 2020-11-13 DIAGNOSIS — O9081 Anemia of the puerperium: Secondary | ICD-10-CM | POA: Diagnosis not present

## 2020-11-13 DIAGNOSIS — O26893 Other specified pregnancy related conditions, third trimester: Secondary | ICD-10-CM | POA: Diagnosis present

## 2020-11-13 DIAGNOSIS — O34211 Maternal care for low transverse scar from previous cesarean delivery: Secondary | ICD-10-CM | POA: Diagnosis not present

## 2020-11-13 LAB — CBC
HCT: 35.9 % — ABNORMAL LOW (ref 36.0–46.0)
Hemoglobin: 11.8 g/dL — ABNORMAL LOW (ref 12.0–15.0)
MCH: 27.3 pg (ref 26.0–34.0)
MCHC: 32.9 g/dL (ref 30.0–36.0)
MCV: 83.1 fL (ref 80.0–100.0)
Platelets: 159 10*3/uL (ref 150–400)
RBC: 4.32 MIL/uL (ref 3.87–5.11)
RDW: 13.9 % (ref 11.5–15.5)
WBC: 11.9 10*3/uL — ABNORMAL HIGH (ref 4.0–10.5)
nRBC: 0 % (ref 0.0–0.2)

## 2020-11-13 LAB — TYPE AND SCREEN
ABO/RH(D): B POS
Antibody Screen: NEGATIVE

## 2020-11-14 LAB — RPR: RPR Ser Ql: NONREACTIVE

## 2020-11-14 LAB — SARS CORONAVIRUS 2 (TAT 6-24 HRS): SARS Coronavirus 2: NEGATIVE

## 2020-11-15 ENCOUNTER — Encounter (HOSPITAL_COMMUNITY)
Admission: AD | Disposition: A | Discharge status: Home / Self Care | Payer: Self-pay | Source: Home / Self Care | Attending: Obstetrics and Gynecology

## 2020-11-15 ENCOUNTER — Other Ambulatory Visit: Payer: Self-pay

## 2020-11-15 ENCOUNTER — Inpatient Hospital Stay (HOSPITAL_COMMUNITY)
Admission: AD | Admit: 2020-11-15 | Discharge: 2020-11-17 | DRG: 787 | Disposition: A | Discharge status: Home / Self Care | Payer: 59 | Attending: Obstetrics and Gynecology | Admitting: Obstetrics and Gynecology

## 2020-11-15 ENCOUNTER — Inpatient Hospital Stay (HOSPITAL_COMMUNITY): Payer: 59 | Admitting: Certified Registered Nurse Anesthetist

## 2020-11-15 ENCOUNTER — Encounter (HOSPITAL_COMMUNITY): Payer: Self-pay | Admitting: Obstetrics and Gynecology

## 2020-11-15 DIAGNOSIS — O3663X0 Maternal care for excessive fetal growth, third trimester, not applicable or unspecified: Principal | ICD-10-CM | POA: Diagnosis present

## 2020-11-15 DIAGNOSIS — Z3A39 39 weeks gestation of pregnancy: Secondary | ICD-10-CM

## 2020-11-15 DIAGNOSIS — D62 Acute posthemorrhagic anemia: Secondary | ICD-10-CM | POA: Diagnosis not present

## 2020-11-15 DIAGNOSIS — O26893 Other specified pregnancy related conditions, third trimester: Secondary | ICD-10-CM | POA: Diagnosis present

## 2020-11-15 DIAGNOSIS — O9081 Anemia of the puerperium: Secondary | ICD-10-CM | POA: Diagnosis not present

## 2020-11-15 DIAGNOSIS — Z3490 Encounter for supervision of normal pregnancy, unspecified, unspecified trimester: Secondary | ICD-10-CM

## 2020-11-15 SURGERY — Surgical Case
Anesthesia: Spinal | Site: Abdomen | Wound class: Clean Contaminated

## 2020-11-15 MED ORDER — LACTATED RINGERS IV SOLN: INTRAVENOUS | Status: DC

## 2020-11-15 MED ORDER — HYDROMORPHONE HCL 1 MG/ML IJ SOLN: 0.2500 mg | INTRAMUSCULAR | Status: DC | PRN

## 2020-11-15 MED ORDER — ONDANSETRON HCL 4 MG/2ML IJ SOLN
INTRAMUSCULAR | Status: DC | PRN
  Administered 2020-11-15: 4 mg via INTRAVENOUS

## 2020-11-15 MED ORDER — OXYCODONE HCL 5 MG PO TABS: 5.0000 mg | ORAL_TABLET | Freq: Once | ORAL | Status: DC | PRN

## 2020-11-15 MED ORDER — PRENATAL MULTIVITAMIN CH
1.0000 | ORAL_TABLET | Freq: Every day | ORAL | Status: DC
  Filled 2020-11-15: qty 1

## 2020-11-15 MED ORDER — MORPHINE SULFATE (PF) 0.5 MG/ML IJ SOLN
INTRAMUSCULAR | Status: AC
  Filled 2020-11-15: qty 10

## 2020-11-15 MED ORDER — STERILE WATER FOR IRRIGATION IR SOLN
Status: DC | PRN
  Administered 2020-11-15: 1000 mL

## 2020-11-15 MED ORDER — SIMETHICONE 80 MG PO CHEW: 80.0000 mg | CHEWABLE_TABLET | ORAL | Status: DC | PRN

## 2020-11-15 MED ORDER — NALOXONE HCL 0.4 MG/ML IJ SOLN: 0.4000 mg | INTRAMUSCULAR | Status: DC | PRN

## 2020-11-15 MED ORDER — DIPHENHYDRAMINE HCL 50 MG/ML IJ SOLN: 12.5000 mg | INTRAMUSCULAR | Status: DC | PRN

## 2020-11-15 MED ORDER — ONDANSETRON HCL 4 MG/2ML IJ SOLN
INTRAMUSCULAR | Status: AC
  Filled 2020-11-15: qty 2

## 2020-11-15 MED ORDER — BUPIVACAINE IN DEXTROSE 0.75-8.25 % IT SOLN
INTRATHECAL | Status: DC | PRN
  Administered 2020-11-15: 1.6 mL via INTRATHECAL

## 2020-11-15 MED ORDER — CEFAZOLIN SODIUM-DEXTROSE 2-4 GM/100ML-% IV SOLN
INTRAVENOUS | Status: AC
  Filled 2020-11-15: qty 100

## 2020-11-15 MED ORDER — OXYTOCIN-SODIUM CHLORIDE 30-0.9 UT/500ML-% IV SOLN
INTRAVENOUS | Status: AC
  Filled 2020-11-15: qty 500

## 2020-11-15 MED ORDER — SODIUM CHLORIDE 0.9% FLUSH
3.0000 mL | INTRAVENOUS | Status: DC | PRN
  Administered 2020-11-16: 3 mL via INTRAVENOUS

## 2020-11-15 MED ORDER — PROMETHAZINE HCL 25 MG/ML IJ SOLN: 6.2500 mg | INTRAMUSCULAR | Status: DC | PRN

## 2020-11-15 MED ORDER — DIBUCAINE (PERIANAL) 1 % EX OINT: 1.0000 "application " | TOPICAL_OINTMENT | CUTANEOUS | Status: DC | PRN

## 2020-11-15 MED ORDER — MENTHOL 3 MG MT LOZG: 1.0000 | LOZENGE | OROMUCOSAL | Status: DC | PRN

## 2020-11-15 MED ORDER — DEXAMETHASONE SODIUM PHOSPHATE 4 MG/ML IJ SOLN
INTRAMUSCULAR | Status: AC
  Filled 2020-11-15: qty 1

## 2020-11-15 MED ORDER — WITCH HAZEL-GLYCERIN EX PADS: 1.0000 "application " | MEDICATED_PAD | CUTANEOUS | Status: DC | PRN

## 2020-11-15 MED ORDER — DEXAMETHASONE SODIUM PHOSPHATE 4 MG/ML IJ SOLN
INTRAMUSCULAR | Status: DC | PRN
  Administered 2020-11-15: 4 mg via INTRAVENOUS

## 2020-11-15 MED ORDER — KETOROLAC TROMETHAMINE 30 MG/ML IJ SOLN
30.0000 mg | Freq: Once | INTRAMUSCULAR | Status: AC | PRN
  Administered 2020-11-15: 30 mg via INTRAVENOUS

## 2020-11-15 MED ORDER — CEFAZOLIN SODIUM-DEXTROSE 2-4 GM/100ML-% IV SOLN
2.0000 g | INTRAVENOUS | Status: AC
  Administered 2020-11-15 (×2): 2 g via INTRAVENOUS

## 2020-11-15 MED ORDER — NALBUPHINE HCL 10 MG/ML IJ SOLN: 5.0000 mg | Freq: Once | INTRAMUSCULAR | Status: AC | PRN

## 2020-11-15 MED ORDER — PHENYLEPHRINE HCL-NACL 20-0.9 MG/250ML-% IV SOLN
INTRAVENOUS | Status: DC | PRN
  Administered 2020-11-15: 60 ug/min via INTRAVENOUS

## 2020-11-15 MED ORDER — TETANUS-DIPHTH-ACELL PERTUSSIS 5-2.5-18.5 LF-MCG/0.5 IM SUSY: 0.5000 mL | PREFILLED_SYRINGE | Freq: Once | INTRAMUSCULAR | Status: DC

## 2020-11-15 MED ORDER — ACETAMINOPHEN 10 MG/ML IV SOLN
INTRAVENOUS | Status: DC | PRN
  Administered 2020-11-15: 1000 mg via INTRAVENOUS

## 2020-11-15 MED ORDER — NALOXONE HCL 4 MG/10ML IJ SOLN
1.0000 ug/kg/h | INTRAVENOUS | Status: DC | PRN
  Filled 2020-11-15: qty 5

## 2020-11-15 MED ORDER — COCONUT OIL OIL: 1.0000 "application " | TOPICAL_OIL | Status: DC | PRN

## 2020-11-15 MED ORDER — MORPHINE SULFATE (PF) 0.5 MG/ML IJ SOLN
INTRAMUSCULAR | Status: DC | PRN
  Administered 2020-11-15: 150 ug via INTRATHECAL

## 2020-11-15 MED ORDER — NALBUPHINE HCL 10 MG/ML IJ SOLN: 5.0000 mg | INTRAMUSCULAR | Status: DC | PRN

## 2020-11-15 MED ORDER — FENTANYL CITRATE (PF) 100 MCG/2ML IJ SOLN
INTRAMUSCULAR | Status: DC | PRN
  Administered 2020-11-15: 15 ug via INTRATHECAL

## 2020-11-15 MED ORDER — OXYTOCIN-SODIUM CHLORIDE 30-0.9 UT/500ML-% IV SOLN
INTRAVENOUS | Status: DC | PRN
  Administered 2020-11-15: 300 mL via INTRAVENOUS

## 2020-11-15 MED ORDER — SENNOSIDES-DOCUSATE SODIUM 8.6-50 MG PO TABS
2.0000 | ORAL_TABLET | Freq: Every day | ORAL | Status: DC
  Administered 2020-11-16: 2 via ORAL
  Filled 2020-11-15: qty 2

## 2020-11-15 MED ORDER — PHENYLEPHRINE HCL (PRESSORS) 10 MG/ML IV SOLN
INTRAVENOUS | Status: DC | PRN
  Administered 2020-11-15 (×2): 80 ug via INTRAVENOUS
  Administered 2020-11-15: 120 ug via INTRAVENOUS
  Administered 2020-11-15: 80 ug via INTRAVENOUS

## 2020-11-15 MED ORDER — NALBUPHINE HCL 10 MG/ML IJ SOLN
5.0000 mg | Freq: Once | INTRAMUSCULAR | Status: AC | PRN
  Administered 2020-11-15: 5 mg via INTRAVENOUS

## 2020-11-15 MED ORDER — DIPHENHYDRAMINE HCL 25 MG PO CAPS
25.0000 mg | ORAL_CAPSULE | ORAL | Status: DC | PRN
  Administered 2020-11-16 (×2): 25 mg via ORAL
  Filled 2020-11-15 (×2): qty 1

## 2020-11-15 MED ORDER — ZOLPIDEM TARTRATE 5 MG PO TABS: 5.0000 mg | ORAL_TABLET | Freq: Every evening | ORAL | Status: DC | PRN

## 2020-11-15 MED ORDER — IBUPROFEN 600 MG PO TABS
600.0000 mg | ORAL_TABLET | Freq: Four times a day (QID) | ORAL | Status: DC
  Administered 2020-11-16 – 2020-11-17 (×4): 600 mg via ORAL
  Filled 2020-11-15 (×4): qty 1

## 2020-11-15 MED ORDER — NALBUPHINE HCL 10 MG/ML IJ SOLN
5.0000 mg | INTRAMUSCULAR | Status: DC | PRN
  Administered 2020-11-15 – 2020-11-16 (×2): 5 mg via INTRAVENOUS
  Filled 2020-11-15 (×3): qty 1

## 2020-11-15 MED ORDER — ACETAMINOPHEN 500 MG PO TABS
1000.0000 mg | ORAL_TABLET | Freq: Four times a day (QID) | ORAL | Status: DC
  Administered 2020-11-15 – 2020-11-17 (×7): 1000 mg via ORAL
  Filled 2020-11-15 (×9): qty 2

## 2020-11-15 MED ORDER — PHENYLEPHRINE HCL-NACL 20-0.9 MG/250ML-% IV SOLN
INTRAVENOUS | Status: AC
  Filled 2020-11-15: qty 250

## 2020-11-15 MED ORDER — HYDROMORPHONE HCL 1 MG/ML IJ SOLN: 0.2000 mg | INTRAMUSCULAR | Status: DC | PRN

## 2020-11-15 MED ORDER — SOD CITRATE-CITRIC ACID 500-334 MG/5ML PO SOLN
30.0000 mL | ORAL | Status: AC
  Administered 2020-11-15: 30 mL via ORAL

## 2020-11-15 MED ORDER — SIMETHICONE 80 MG PO CHEW
80.0000 mg | CHEWABLE_TABLET | Freq: Three times a day (TID) | ORAL | Status: DC
  Administered 2020-11-15 – 2020-11-17 (×5): 80 mg via ORAL
  Filled 2020-11-15 (×5): qty 1

## 2020-11-15 MED ORDER — SOD CITRATE-CITRIC ACID 500-334 MG/5ML PO SOLN
ORAL | Status: AC
  Filled 2020-11-15: qty 30

## 2020-11-15 MED ORDER — OXYCODONE HCL 5 MG PO TABS: 5.0000 mg | ORAL_TABLET | ORAL | Status: DC | PRN

## 2020-11-15 MED ORDER — SODIUM CHLORIDE 0.9 % IR SOLN
Status: DC | PRN
  Administered 2020-11-15: 1000 mL

## 2020-11-15 MED ORDER — FENTANYL CITRATE (PF) 100 MCG/2ML IJ SOLN
INTRAMUSCULAR | Status: AC
  Filled 2020-11-15: qty 2

## 2020-11-15 MED ORDER — KETOROLAC TROMETHAMINE 30 MG/ML IJ SOLN
INTRAMUSCULAR | Status: AC
  Filled 2020-11-15: qty 1

## 2020-11-15 MED ORDER — MEPERIDINE HCL 25 MG/ML IJ SOLN: 6.2500 mg | INTRAMUSCULAR | Status: DC | PRN

## 2020-11-15 MED ORDER — OXYCODONE HCL 5 MG/5ML PO SOLN: 5.0000 mg | Freq: Once | ORAL | Status: DC | PRN

## 2020-11-15 MED ORDER — OXYTOCIN-SODIUM CHLORIDE 30-0.9 UT/500ML-% IV SOLN
2.5000 [IU]/h | INTRAVENOUS | Status: DC
  Administered 2020-11-15: 2.5 [IU]/h via INTRAVENOUS
  Filled 2020-11-15: qty 500

## 2020-11-15 MED ORDER — PANTOPRAZOLE SODIUM 40 MG PO TBEC
40.0000 mg | DELAYED_RELEASE_TABLET | Freq: Every day | ORAL | Status: DC
  Administered 2020-11-16 – 2020-11-17 (×2): 40 mg via ORAL
  Filled 2020-11-15 (×2): qty 1

## 2020-11-15 MED ORDER — KETOROLAC TROMETHAMINE 30 MG/ML IJ SOLN
30.0000 mg | Freq: Four times a day (QID) | INTRAMUSCULAR | Status: AC
  Administered 2020-11-15 – 2020-11-16 (×4): 30 mg via INTRAVENOUS
  Filled 2020-11-15 (×4): qty 1

## 2020-11-15 MED ORDER — DIPHENHYDRAMINE HCL 25 MG PO CAPS: 25.0000 mg | ORAL_CAPSULE | Freq: Four times a day (QID) | ORAL | Status: DC | PRN

## 2020-11-15 MED ORDER — ACETAMINOPHEN 10 MG/ML IV SOLN
INTRAVENOUS | Status: AC
  Filled 2020-11-15: qty 100

## 2020-11-15 SURGICAL SUPPLY — 38 items
BENZOIN TINCTURE PRP APPL 2/3 (GAUZE/BANDAGES/DRESSINGS) ×2 IMPLANT
CHLORAPREP W/TINT 26ML (MISCELLANEOUS) ×2 IMPLANT
CLAMP CORD UMBIL (MISCELLANEOUS) IMPLANT
CLOSURE STERI STRIP 1/2 X4 (GAUZE/BANDAGES/DRESSINGS) ×2 IMPLANT
CLOTH BEACON ORANGE TIMEOUT ST (SAFETY) ×2 IMPLANT
CLSR STERI-STRIP ANTIMIC 1/2X4 (GAUZE/BANDAGES/DRESSINGS) ×2 IMPLANT
DRSG OPSITE POSTOP 4X10 (GAUZE/BANDAGES/DRESSINGS) ×2 IMPLANT
ELECT REM PT RETURN 9FT ADLT (ELECTROSURGICAL) ×2
ELECTRODE REM PT RTRN 9FT ADLT (ELECTROSURGICAL) ×1 IMPLANT
EXTRACTOR VACUUM KIWI (MISCELLANEOUS) ×2 IMPLANT
GAUZE SPONGE 4X4 12PLY STRL LF (GAUZE/BANDAGES/DRESSINGS) ×4 IMPLANT
GLOVE BIO SURGEON STRL SZ 6.5 (GLOVE) ×2 IMPLANT
GLOVE BIOGEL PI IND STRL 6.5 (GLOVE) ×1 IMPLANT
GLOVE BIOGEL PI IND STRL 7.0 (GLOVE) ×2 IMPLANT
GLOVE BIOGEL PI INDICATOR 6.5 (GLOVE) ×1
GLOVE BIOGEL PI INDICATOR 7.0 (GLOVE) ×2
GOWN STRL REUS W/TWL LRG LVL3 (GOWN DISPOSABLE) ×4 IMPLANT
HEMOSTAT ARISTA ABSORB 3G PWDR (HEMOSTASIS) ×2 IMPLANT
KIT ABG SYR 3ML LUER SLIP (SYRINGE) ×2 IMPLANT
NEEDLE HYPO 25X5/8 SAFETYGLIDE (NEEDLE) ×2 IMPLANT
NS IRRIG 1000ML POUR BTL (IV SOLUTION) ×2 IMPLANT
PACK C SECTION WH (CUSTOM PROCEDURE TRAY) ×2 IMPLANT
PAD ABD 7.5X8 STRL (GAUZE/BANDAGES/DRESSINGS) ×2 IMPLANT
PAD OB MATERNITY 4.3X12.25 (PERSONAL CARE ITEMS) ×2 IMPLANT
PENCIL SMOKE EVAC W/HOLSTER (ELECTROSURGICAL) ×2 IMPLANT
SPONGE GAUZE 4X4 12PLY STER LF (GAUZE/BANDAGES/DRESSINGS) ×4 IMPLANT
SPONGE LAP 18X18 X RAY DECT (DISPOSABLE) ×2 IMPLANT
STRIP CLOSURE SKIN 1/2X4 (GAUZE/BANDAGES/DRESSINGS) ×2 IMPLANT
SUT PLAIN 0 NONE (SUTURE) IMPLANT
SUT PLAIN 2 0 (SUTURE) ×2
SUT PLAIN ABS 2-0 CT1 27XMFL (SUTURE) ×1 IMPLANT
SUT VIC AB 0 CT1 36 (SUTURE) ×2 IMPLANT
SUT VIC AB 0 CTX 36 (SUTURE) ×2
SUT VIC AB 0 CTX36XBRD ANBCTRL (SUTURE) ×2 IMPLANT
SUT VIC AB 4-0 PS2 27 (SUTURE) ×2 IMPLANT
TOWEL OR 17X24 6PK STRL BLUE (TOWEL DISPOSABLE) ×2 IMPLANT
TRAY FOLEY W/BAG SLVR 14FR LF (SET/KITS/TRAYS/PACK) IMPLANT
WATER STERILE IRR 1000ML POUR (IV SOLUTION) ×2 IMPLANT

## 2020-11-15 NOTE — Transfer of Care (Signed)
Immediate Anesthesia Transfer of Care Note  Patient: Jenna Lane  Procedure(s) Performed: PRIMARY CESAREAN SECTION EDC: 11-21-20 ALLERG: [name] (Abdomen)  Patient Location: PACU  Anesthesia Type:Spinal  Level of Consciousness: awake, alert  and oriented  Airway & Oxygen Therapy: Patient Spontanous Breathing  Post-op Assessment: Report given to RN and Post -op Vital signs reviewed and stable  Post vital signs: Reviewed and stable  Last Vitals:  Vitals Value Taken Time  BP 99/69 11/15/20 1125  Temp 36.6 C 11/15/20 1125  Pulse 86 11/15/20 1128  Resp 17 11/15/20 1128  SpO2 97 % 11/15/20 1128  Vitals shown include unvalidated device data.  Last Pain:  Vitals:   11/15/20 1125  TempSrc: Oral         Complications: No notable events documented.

## 2020-11-15 NOTE — Anesthesia Preprocedure Evaluation (Signed)
Anesthesia Evaluation  Patient identified by MRN, date of birth, ID band Patient awake    Reviewed: Allergy & Precautions, H&P , NPO status , Patient's Chart, lab work & pertinent test results, reviewed documented beta blocker date and time   Airway Mallampati: II  TM Distance: >3 FB Neck ROM: full    Dental  (+) Teeth Intact, Dental Advisory Given   Pulmonary asthma ,    breath sounds clear to auscultation       Cardiovascular negative cardio ROS   Rhythm:regular Rate:Normal     Neuro/Psych  Headaches, negative psych ROS   GI/Hepatic Neg liver ROS, GERD  ,  Endo/Other  negative endocrine ROS  Renal/GU negative Renal ROS     Musculoskeletal   Abdominal (+) + obese,   Peds  Hematology   Anesthesia Other Findings   Reproductive/Obstetrics (+) Pregnancy                             Anesthesia Physical  Anesthesia Plan  ASA: 2  Anesthesia Plan: Spinal   Post-op Pain Management:    Induction:   PONV Risk Score and Plan: Treatment may vary due to age or medical condition  Airway Management Planned: Natural Airway  Additional Equipment:   Intra-op Plan:   Post-operative Plan:   Informed Consent: I have reviewed the patients History and Physical, chart, labs and discussed the procedure including the risks, benefits and alternatives for the proposed anesthesia with the patient or authorized representative who has indicated his/her understanding and acceptance.     Dental Advisory Given and Dental advisory given  Plan Discussed with: CRNA, Surgeon and Anesthesiologist  Anesthesia Plan Comments:         Anesthesia Quick Evaluation

## 2020-11-15 NOTE — Op Note (Signed)
PROCEDURE DATE: 11/15/20   PREOPERATIVE DIAGNOSIS: primary elective Cesarean section, LGA   POSTOPERATIVE DIAGNOSIS: The same   PROCEDURE:    Primary Low Transverse Cesarean Section with vacuum assisted devliery of vertex   SURGEON:  Dr. Lucillie Garfinkel   INDICATIONS: This is a 30yo G1P0 at 29 wga requiring cesarean section secondary to primary elective and LGA. Last [place] in office showed EFW of 3780g with AC > 97%ile She was counseled and Decision made to proceed with LTCS. The risks of cesarean section discussed with the patient included but were not limited to: bleeding which may require transfusion or reoperation; infection which may require antibiotics; injury to bowel, bladder, ureters or other surrounding organs; injury to the fetus; need for additional procedures including hysterectomy in the event of a life-threatening hemorrhage; placental abnormalities wth subsequent pregnancies, incisional problems, thromboembolic phenomenon and other postoperative/anesthesia complications. The patient agreed with the proposed plan, giving informed consent for the procedure.     FINDINGS:  Viable female infant in vertex presentation, APGARs pending,  Weight pending, Amniotic fluid clear,  Intact placenta, three vessel cord.  Grossly normal uterus .   ANESTHESIA:    Epidural ESTIMATED BLOOD LOSS: 969cc SPECIMENS: Placenta for routine COMPLICATIONS: None immediate   PROCEDURE IN DETAIL:  The patient received intravenous antibiotics (2g Ancef) and had sequential compression devices applied to her lower extremities while in the preoperative area.  She was then taken to the operating room where epidural anesthesia was dosed up to surgical level and was found to be adequate. She was then placed in a dorsal supine position with a leftward tilt, and prepped and draped in a sterile manner.  A foley catheter was placed into her bladder and attached to constant gravity.  After an adequate timeout was performed, a  Pfannenstiel skin incision was made with scalpel and carried through to the underlying layer of fascia. The fascia was incised in the midline and this incision was extended bilaterally using the Mayo scissors. Kocher clamps were applied to the superior aspect of the fascial incision and the underlying rectus muscles were dissected off bluntly. A similar process was carried out on the inferior aspect of the facial incision. The rectus muscles were separated in the midline bluntly and the peritoneum was entered bluntly.  A bladder flap was created sharply and developed bluntly. A transverse hysterotomy was made with a scalpel and extended bilaterally bluntly. The bladder blade was then removed. The infant was successfully delivered requiring a kiwi vacuum for gently traction with delivery of fetal vertex and cord was clamped and cut and infant was handed over to awaiting neonatology team. Uterine massage was then administered and the placenta delivered intact with three-vessel cord. The uterus was cleared of clot and debris. Brisk bleeding was noted from oozing serosa. The hysterotomy was closed with 0 vicryl.  A second imbricating suture of 0-vicryl was used to reinforce the incision and aid in hemostasis. Bleeding improved greatly. Arista placed for oozing serosa and hemostasis achieved. The fascia was closed with 0-Vicryl in a running fashion with good restoration of anatomy.  The subcutaneus tissue was irrigated and was reapproximated using three interrupted plain gut stitches.  The skin was closed with 4-0 Vicryl in a subcuticular fashion.  All surgical site and was hemostatic at end of procedure) without any further bleeding on exam.   It's a boy - "[name]"!!     Pt tolerated the procedure well. All sponge/lap/needle counts were correct  X 2. Pt taken to  recovery room in stable condition.     Lucillie Garfinkel MD

## 2020-11-15 NOTE — Anesthesia Procedure Notes (Signed)
Spinal  Patient location during procedure: OB Start time: 11/15/2020 9:50 AM End time: 11/15/2020 9:55 AM Reason for block: surgical anesthesia Staffing Performed: anesthesiologist  Anesthesiologist: Lynda Rainwater, MD Preanesthetic Checklist Completed: patient identified, IV checked, risks and benefits discussed, surgical consent, monitors and equipment checked, pre-op evaluation and timeout performed Spinal Block Patient position: sitting Prep: DuraPrep and site prepped and draped Patient monitoring: heart rate, cardiac monitor, continuous pulse ox and blood pressure Approach: midline Location: L3-4 Injection technique: single-shot Needle Needle type: Pencan  Needle gauge: 24 G Needle length: 10 cm Assessment Sensory level: T4 Events: CSF return

## 2020-11-15 NOTE — Anesthesia Postprocedure Evaluation (Signed)
Anesthesia Post Note  Patient: Jenna Lane  Procedure(s) Performed: PRIMARY CESAREAN SECTION EDC: 11-21-20 ALLERG: [name] (Abdomen)     Patient location during evaluation: PACU Anesthesia Type: Spinal Level of consciousness: awake and alert Pain management: pain level controlled Vital Signs Assessment: post-procedure vital signs reviewed and stable Respiratory status: spontaneous breathing, nonlabored ventilation and respiratory function stable Cardiovascular status: blood pressure returned to baseline and stable Postop Assessment: no apparent nausea or vomiting Anesthetic complications: no   No notable events documented.  Last Vitals:  Vitals:   11/15/20 1200 11/15/20 1215  BP: 113/86 103/74  Pulse: 81 90  Resp: (!) 21 18  Temp:    SpO2: 97% 99%    Last Pain:  Vitals:   11/15/20 1150  TempSrc:   PainSc: 1    Pain Goal:                Epidural/Spinal Function Cutaneous sensation: Able to Wiggle Toes (11/15/20 1215), Patient able to flex knees: Yes (11/15/20 1215), Patient able to lift hips off bed: Yes (11/15/20 1215), Back pain beyond tenderness at insertion site: No (11/15/20 1215), Progressively worsening motor and/or sensory loss: No (11/15/20 1215), Bowel and/or bladder incontinence post epidural: No (11/15/20 1215)  Lynda Rainwater

## 2020-11-15 NOTE — Progress Notes (Signed)
No updates to above H&P. Patient arrived NPO and was consented in PACU. Risks again discussed, all questions answered, and consent signed. Proceed with above surgery.    Leticia Coletta MD  

## 2020-11-16 LAB — CBC
HCT: 25.9 % — ABNORMAL LOW (ref 36.0–46.0)
Hemoglobin: 8.4 g/dL — ABNORMAL LOW (ref 12.0–15.0)
MCH: 27.1 pg (ref 26.0–34.0)
MCHC: 32.4 g/dL (ref 30.0–36.0)
MCV: 83.5 fL (ref 80.0–100.0)
Platelets: 131 10*3/uL — ABNORMAL LOW (ref 150–400)
RBC: 3.1 MIL/uL — ABNORMAL LOW (ref 3.87–5.11)
RDW: 14.1 % (ref 11.5–15.5)
WBC: 10.2 10*3/uL (ref 4.0–10.5)
nRBC: 0 % (ref 0.0–0.2)

## 2020-11-16 LAB — BIRTH TISSUE RECOVERY COLLECTION (PLACENTA DONATION)

## 2020-11-16 MED ORDER — FERROUS SULFATE 325 (65 FE) MG PO TABS
325.0000 mg | ORAL_TABLET | Freq: Every day | ORAL | Status: DC
  Administered 2020-11-16 – 2020-11-17 (×2): 325 mg via ORAL
  Filled 2020-11-16 (×2): qty 1

## 2020-11-16 NOTE — Progress Notes (Addendum)
Subjective: Postpartum Day 1: Cesarean Delivery Patient reports tolerating PO, + flatus, and no problems voiding.    Objective: Vital signs in last 24 hours: Temp:  [97.1 F (36.2 C)-98.6 F (37 C)] 98.3 F (36.8 C) (11/03 0733) Pulse Rate:  [78-105] 105 (11/03 0733) Resp:  [12-25] 16 (11/03 0327) BP: (99-127)/(62-89) 109/73 (11/03 0733) SpO2:  [96 %-100 %] 100 % (11/03 7262)  Physical Exam:  General: alert, cooperative, and appears stated age Lochia: appropriate Uterine Fundus: firm Incision: healing well.  Pressure dressing removed and honeycomb dressing 30% saturated with old blood DVT Evaluation: No evidence of DVT seen on physical exam. Negative Homan's sign. No cords or calf tenderness.  Recent Labs    11/13/20 1146 11/16/20 0510  HGB 11.8* 8.4*  HCT 35.9* 25.9*    Assessment/Plan: Status post Cesarean section. Doing well postoperatively.  Continue current care. Acute blood loss anemia-patient is asymptomatic and normal vitals and UOP.  Will start FeSO4. Replace honeycomb Circ-patient counseled re: risk of bleeding, infection, and scarring.  All questions were answered and patient wishes to proceed.  Latha Staunton 11/16/2020, 8:38 AM

## 2020-11-17 ENCOUNTER — Other Ambulatory Visit (HOSPITAL_COMMUNITY): Payer: Self-pay

## 2020-11-17 MED ORDER — OXYCODONE HCL 5 MG PO TABS
5.0000 mg | ORAL_TABLET | ORAL | 0 refills | Status: DC | PRN
  Filled 2020-11-17: qty 18, 2d supply, fill #0

## 2020-11-17 MED ORDER — IBUPROFEN 600 MG PO TABS
600.0000 mg | ORAL_TABLET | Freq: Four times a day (QID) | ORAL | 0 refills | Status: DC
  Filled 2020-11-17: qty 30, 8d supply, fill #0

## 2020-11-17 MED ORDER — FERROUS SULFATE 325 (65 FE) MG PO TABS
325.0000 mg | ORAL_TABLET | Freq: Every day | ORAL | 3 refills | Status: DC
  Filled 2020-11-17: qty 30, 30d supply, fill #0

## 2020-11-17 MED ORDER — ACETAMINOPHEN 500 MG PO TABS
500.0000 mg | ORAL_TABLET | Freq: Four times a day (QID) | ORAL | 0 refills | Status: DC | PRN
  Filled 2020-11-17: qty 30, 8d supply, fill #0

## 2020-11-17 MED ORDER — DOCUSATE SODIUM 100 MG PO CAPS
100.0000 mg | ORAL_CAPSULE | Freq: Every day | ORAL | 0 refills | Status: DC | PRN
  Filled 2020-11-17: qty 10, 10d supply, fill #0

## 2020-11-17 NOTE — Progress Notes (Signed)
Postpartum Progress Note  Postpartum Day 2 s/p primary Cesarean section.  Subjective:  Patient reports no overnight events.  She reports well controlled pain, ambulating without difficulty, voiding spontaneously, tolerating PO.  Vaginal bleeding is light.  Objective: Blood pressure 110/60, pulse 100, temperature 97.9 F (36.6 C), resp. rate 19, height 5\' 3"  (1.6 m), weight 105.7 kg, last menstrual period 02/15/2020, SpO2 100 %, unknown if currently breastfeeding.  Physical Exam:  General: alert and no distress Lochia: appropriate Uterine Fundus: firm Incision: dressing in place DVT Evaluation: No evidence of DVT seen on physical exam.  Recent Labs    11/16/20 0510  HGB 8.4*  HCT 25.9*    Assessment/Plan: Postpartum Day 2, s/p C-section Acute blood loss anemia, continue Fe Lactation following Baby boy s/p circ Doing well, continue routine postpartum care. Anticipate discharge today   LOS: 2 days   [name] 11/17/2020, 7:19 AM

## 2020-11-17 NOTE — Discharge Summary (Signed)
Obstetric Discharge Summary  Jenna Lane is a 30 y.o. female that presented on 11/15/2020 for elective primary C section.  She was admitted to labor and delivery for her procedure.  She delivered a viable female infant on 11/15/20.  Her postpartum course was uncomplicated but notable for postoperative acute blood loss anemia. This was treated with oral iron supplementation. On PPD#2, she reported well controlled pain, spontaneous voiding, ambulating without difficulty, and tolerating PO.  She was stable for discharge home on 11/17/20 with plans for in-office follow up.  Hemoglobin  Date Value Ref Range Status  11/16/2020 8.4 (L) 12.0 - 15.0 g/dL Final  01/20/2020 14.4 11.1 - 15.9 g/dL Final   Hemoglobin, fingerstick  Date Value Ref Range Status  04/05/2013 13.0 12.0 - 16.0 g/dL Final   HCT  Date Value Ref Range Status  11/16/2020 25.9 (L) 36.0 - 46.0 % Final   Hematocrit  Date Value Ref Range Status  01/20/2020 43.0 34.0 - 46.6 % Final    Physical Exam:  General: alert and no distress Lochia: appropriate Uterine Fundus: firm Incision: healing well DVT Evaluation: No evidence of DVT seen on physical exam.  Discharge Diagnoses: Term Pregnancy-delivered  Discharge Information: Date: 11/17/2020 Activity: Pelvic rest, as tolerated Diet: routine Medications: Tylenol, motrin, oxycodone, iron sulfate and colace Condition: stable Instructions: Refer to practice specific booklet.  Discussed prior to discharge.  Discharge to: [facility] Follow up.   Why: Please follow up for 6 week postpartum visit. Contact information: [address] [address] [phone]                 Newborn Data: Live born female  Birth Weight: 8 lb 9.6 oz (3900 g) APGAR: 90, 10  Newborn Delivery   Birth date/time: 11/15/2020 10:28:00 Delivery type: C-Section, Vacuum Assisted Trial of labor: No C-section  categorization: Primary      Home with mother.  [name] 11/17/2020, 4:49 PM

## 2020-11-30 ENCOUNTER — Telehealth (HOSPITAL_COMMUNITY): Payer: Self-pay | Admitting: *Deleted

## 2020-11-30 NOTE — Telephone Encounter (Signed)
Mom reports feeling good. Incision is healing well. No concerns about herself at present. EPDS=0 (hospital score = 0)  Mom reports baby is well. Feeding, peeing, and pooping without difficulty. Peds appt this afternoon. Baby sleeps in bassinet in parents room on back. Reviewed safe sleep. Mom has no concerns about baby at this time.  Odis Hollingshead, RN 11-30-2020 at 10:50am

## 2020-12-21 ENCOUNTER — Other Ambulatory Visit (HOSPITAL_COMMUNITY): Payer: Self-pay

## 2020-12-21 DIAGNOSIS — Z1389 Encounter for screening for other disorder: Secondary | ICD-10-CM | POA: Diagnosis not present

## 2020-12-21 MED ORDER — ETONOGESTREL-ETHINYL ESTRADIOL 0.12-0.015 MG/24HR VA RING
VAGINAL_RING | VAGINAL | 3 refills | Status: DC
  Filled 2020-12-21: qty 3, 84d supply, fill #0

## 2020-12-28 DIAGNOSIS — H5213 Myopia, bilateral: Secondary | ICD-10-CM | POA: Diagnosis not present

## 2020-12-28 DIAGNOSIS — H52203 Unspecified astigmatism, bilateral: Secondary | ICD-10-CM | POA: Diagnosis not present

## 2020-12-29 ENCOUNTER — Other Ambulatory Visit (HOSPITAL_COMMUNITY): Payer: Self-pay

## 2021-02-01 DIAGNOSIS — Z3040 Encounter for surveillance of contraceptives, unspecified: Secondary | ICD-10-CM | POA: Diagnosis not present

## 2021-02-01 DIAGNOSIS — F53 Postpartum depression: Secondary | ICD-10-CM | POA: Diagnosis not present

## 2021-02-08 ENCOUNTER — Telehealth: Payer: Self-pay | Admitting: Internal Medicine

## 2021-02-08 NOTE — Telephone Encounter (Signed)
Patient is looking for a new primary care physician. She was previously seen by Dr Baker Janus but she is no longer there.  Okay per Dr Quay Burow.  Appointment scheduled.

## 2021-02-25 ENCOUNTER — Encounter: Payer: Self-pay | Admitting: Internal Medicine

## 2021-02-25 NOTE — Progress Notes (Signed)
Subjective:    Patient ID: Jenna Lane, female    DOB: 07-Apr-1990, 31 y.o.   MRN: [id number]   This visit occurred during the SARS-CoV-2 public health emergency.  Safety protocols were in place, including screening questions prior to the visit, additional usage of staff PPE, and extensive cleaning of exam room while observing appropriate contact time as indicated for disinfecting solutions.    HPI She is here to establish with a new pcp.  She is here for a physical exam.   She has not concerns.   Medications and allergies reviewed with patient and updated if appropriate.  Patient Active Problem List   Diagnosis Date Noted   Vitamin D deficiency 02/22/2020   Insulin resistance 02/22/2020   Class 2 severe obesity with serious comorbidity and body mass index (BMI) of 35.0 to 35.9 in adult [facility]) 02/22/2020   IBS (irritable bowel syndrome)    Postconcussion syndrome 11/22/2015    Current Outpatient Medications on File Prior to Visit  Medication Sig Dispense Refill   NUVARING 0.12-0.015 MG/24HR vaginal ring Place 1 each vaginally every 30 (thirty) days.     sertraline (ZOLOFT) 50 MG tablet Take 50 mg by mouth daily.     No current facility-administered medications on file prior to visit.    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   IBS (irritable bowel syndrome)    MRSA infection 2009   on skin   Right ankle injury    Vitamin D deficiency     Past Surgical History:  Procedure Laterality Date   CESAREAN SECTION N/A 11/15/2020   Procedure: PRIMARY CESAREAN SECTION EDC: 11-21-20 ALLERG: UXLKG;  Surgeon: Tyson Dense, MD;  Location: [place] [facility] ORS;  Service: Obstetrics;  Laterality: N/A;   COLPOSCOPY  2011   skin  cyst excision     left axillary   TONSILLECTOMY AND ADENOIDECTOMY N/A 01/02/2013   Procedure: TONSILLECTOMY ;  Surgeon: Jodi Marble, MD;  Location: [facility];  Service: ENT;  Laterality: N/A;   WISDOM TOOTH EXTRACTION      Social History   Socioeconomic History   Marital status: Married    Spouse name: Nohealani Medinger   Number of children: 0   Years of education: Associates   Highest education level: Not on file  Occupational History   Occupation: Biochemist, clinical- xray tech  Tobacco Use   Smoking status: Never   Smokeless tobacco: Never  Vaping Use   Vaping Use: Never used  Substance and Sexual Activity   Alcohol use: Not Currently    Alcohol/week: 2.0 standard drinks    Types: 2 Standard drinks or equivalent per week   Drug use: No   Sexual activity: Yes    Partners: Male    Birth control/protection: Inserts    Comment: nuvaring  Other Topics Concern   Not on file  Social History Narrative   Lives alone   Caffeine use: 1 cup daily   Right-handed   Social Determinants of Health   Financial Resource Strain: Not on file  Food Insecurity: Not on file  Transportation Needs: Not on file  Physical Activity: Not on file  Stress:  Not on file  Social Connections: Not on file    Family History  Problem Relation Age of Onset   Hyperlipidemia Father    Hypertension Father        ?   Depression Maternal Uncle    Lung cancer Maternal Grandfather    Heart disease Paternal Grandmother    Heart disease Paternal Grandfather     Review of Systems  Constitutional:  Negative for chills and fever.  Eyes:  Negative for visual disturbance.  Respiratory:  Negative for cough, shortness of breath and wheezing.   Cardiovascular:  Negative for chest pain, palpitations and leg swelling.  Gastrointestinal:  Negative for abdominal pain, blood in stool, constipation, diarrhea and nausea.       No gerd  Genitourinary:  Negative for dysuria and hematuria.  Musculoskeletal:  Negative  for arthralgias and back pain.  Skin:  Negative for rash.  Neurological:  Negative for dizziness, light-headedness, numbness and headaches.  Psychiatric/Behavioral:  Positive for dysphoric mood (post partum). The patient is not nervous/anxious.       Objective:   Vitals:   02/27/21 0753  BP: 116/80  Pulse: 74  Temp: 98.3 F (36.8 C)  SpO2: 98%   Filed Weights   02/27/21 0753  Weight: 212 lb 12.8 oz (96.5 kg)   Body mass index is 37.7 kg/m.  BP Readings from Last 3 Encounters:  02/27/21 116/80  11/17/20 108/62  09/02/20 118/66    Wt Readings from Last 3 Encounters:  02/27/21 212 lb 12.8 oz (96.5 kg)  11/15/20 233 lb (105.7 kg)  11/07/20 233 lb (105.7 kg)    Depression screen [facility] 2/9 01/20/2020 10/29/2019  Decreased Interest 1 0  Down, Depressed, Hopeless 2 0  PHQ - 2 Score 3 0  Altered sleeping 2 -  Tired, decreased energy 3 -  Change in appetite 3 -  Feeling bad or failure about yourself  2 -  Trouble concentrating 2 -  Moving slowly or fidgety/restless 1 -  Suicidal thoughts 0 -  PHQ-9 Score 16 -     No flowsheet data found.     Physical Exam Constitutional: She appears well-developed and well-nourished. No distress.  HENT:  Head: Normocephalic and atraumatic.  Right Ear: External ear normal. Normal ear canal and TM Left Ear: External ear normal.  Normal ear canal and TM Mouth/Throat: Oropharynx is clear and moist.  Eyes: Conjunctivae and EOM are normal.  Neck: Neck supple. No tracheal deviation present. No thyromegaly present.  No carotid bruit  Cardiovascular: Normal rate, regular rhythm and normal heart sounds.   No murmur heard.  No edema. Pulmonary/Chest: Effort normal and breath sounds normal. No respiratory distress. She has no wheezes. She has no rales.  Breast: deferred   Abdominal: Soft. She exhibits no distension. There is no tenderness.  Lymphadenopathy: She has no cervical adenopathy.  Skin: Skin is warm and dry. She is not  diaphoretic.  Psychiatric: She has a normal mood and affect. Her behavior is normal.     Lab Results  Component Value Date   WBC 10.2 11/16/2020   HGB 8.4 (L) 11/16/2020   HCT 25.9 (L) 11/16/2020   PLT 131 (L) 11/16/2020   GLUCOSE 93 09/01/2020   CHOL 200 (H) 01/20/2020   TRIG 94 01/20/2020   HDL 59 01/20/2020   LDLCALC 124 (H) 01/20/2020   ALT 14 09/01/2020   AST 17 09/01/2020   NA 136 09/01/2020   K 3.3 (L) 09/01/2020   CL 106 09/01/2020  CREATININE 0.47 09/01/2020   BUN <5 (L) 09/01/2020   CO2 21 (L) 09/01/2020   TSH 3.330 01/20/2020   HGBA1C 4.8 01/20/2020         Assessment & Plan:   Physical exam: Screening blood work  ordered Exercise  walks on occasion Weight  advised weight loss Substance abuse  none   Reviewed recommended immunizations.   Health Maintenance  Topic Date Due   COVID-19 Vaccine (4 - Booster for Pfizer series) 03/15/2021 (Originally 12/23/2019)   PAP SMEAR-Modifier  06/26/2021   TETANUS/TDAP  08/07/2027   INFLUENZA VACCINE  Completed   Hepatitis C Screening  Completed   HIV Screening  Completed   HPV VACCINES  Aged Out          See Problem List for Assessment and Plan of chronic medical problems.

## 2021-02-25 NOTE — Patient Instructions (Addendum)
Blood work was ordered.      Medications changes include :   None    Please followup in 1 year   Health Maintenance, Female Adopting a healthy lifestyle and getting preventive care are important in promoting health and wellness. Ask your health care provider about: The right schedule for you to have regular tests and exams. Things you can do on your own to prevent diseases and keep yourself healthy. What should I know about diet, weight, and exercise? Eat a healthy diet  Eat a diet that includes plenty of vegetables, fruits, low-fat dairy products, and lean protein. Do not eat a lot of foods that are high in solid fats, added sugars, or sodium. Maintain a healthy weight Body mass index (BMI) is used to identify weight problems. It estimates body fat based on height and weight. Your health care provider can help determine your BMI and help you achieve or maintain a healthy weight. Get regular exercise Get regular exercise. This is one of the most important things you can do for your health. Most adults should: Exercise for at least 150 minutes each week. The exercise should increase your heart rate and make you sweat (moderate-intensity exercise). Do strengthening exercises at least twice a week. This is in addition to the moderate-intensity exercise. Spend less time sitting. Even light physical activity can be beneficial. Watch cholesterol and blood lipids Have your blood tested for lipids and cholesterol at 32 years of age, then have this test every 5 years. Have your cholesterol levels checked more often if: Your lipid or cholesterol levels are high. You are older than 32 years of age. You are at high risk for heart disease. What should I know about cancer screening? Depending on your health history and family history, you may need to have cancer screening at various ages. This may include screening for: Breast cancer. Cervical cancer. Colorectal cancer. Skin cancer. Lung  cancer. What should I know about heart disease, diabetes, and high blood pressure? Blood pressure and heart disease High blood pressure causes heart disease and increases the risk of stroke. This is more likely to develop in people who have high blood pressure readings or are overweight. Have your blood pressure checked: Every 3-5 years if you are 13-42 years of age. Every year if you are 60 years old or older. Diabetes Have regular diabetes screenings. This checks your fasting blood sugar level. Have the screening done: Once every three years after age 59 if you are at a normal weight and have a low risk for diabetes. More often and at a younger age if you are overweight or have a high risk for diabetes. What should I know about preventing infection? Hepatitis B If you have a higher risk for hepatitis B, you should be screened for this virus. Talk with your health care provider to find out if you are at risk for hepatitis B infection. Hepatitis C Testing is recommended for: Everyone born from [birth year] through 1966. Anyone with known risk factors for hepatitis C. Sexually transmitted infections (STIs) Get screened for STIs, including gonorrhea and chlamydia, if: You are sexually active and are younger than 32 years of age. You are older than 32 years of age and your health care provider tells you that you are at risk for this type of infection. Your sexual activity has changed since you were last screened, and you are at increased risk for chlamydia or gonorrhea. Ask your health care provider if you are at risk. Ask  your health care provider about whether you are at high risk for HIV. Your health care provider may recommend a prescription medicine to help prevent HIV infection. If you choose to take medicine to prevent HIV, you should first get tested for HIV. You should then be tested every 3 months for as long as you are taking the medicine. Pregnancy If you are about to stop having your  period (premenopausal) and you may become pregnant, seek counseling before you get pregnant. Take 400 to 800 micrograms (mcg) of folic acid every day if you become pregnant. Ask for birth control (contraception) if you want to prevent pregnancy. Osteoporosis and menopause Osteoporosis is a disease in which the bones lose minerals and strength with aging. This can result in bone fractures. If you are 37 years old or older, or if you are at risk for osteoporosis and fractures, ask your health care provider if you should: Be screened for bone loss. Take a calcium or vitamin D supplement to lower your risk of fractures. Be given hormone replacement therapy (HRT) to treat symptoms of menopause. Follow these instructions at home: Alcohol use Do not drink alcohol if: Your health care provider tells you not to drink. You are pregnant, may be pregnant, or are planning to become pregnant. If you drink alcohol: Limit how much you have to: 0-1 drink a day. Know how much alcohol is in your drink. In the [place] one drink equals one 12 oz bottle of beer (355 mL), one 5 oz glass of wine (148 mL), or one 1 oz glass of hard liquor (44 mL). Lifestyle Do not use any products that contain nicotine or tobacco. These products include cigarettes, chewing tobacco, and vaping devices, such as e-cigarettes. If you need help quitting, ask your health care provider. Do not use street drugs. Do not share needles. Ask your health care provider for help if you need support or information about quitting drugs. General instructions Schedule regular health, dental, and eye exams. Stay current with your vaccines. Tell your health care provider if: You often feel depressed. You have ever been abused or do not feel safe at home. Summary Adopting a healthy lifestyle and getting preventive care are important in promoting health and wellness. Follow your health care provider's instructions about healthy diet, exercising, and  getting tested or screened for diseases. Follow your health care provider's instructions on monitoring your cholesterol and blood pressure. This information is not intended to replace advice given to you by your health care provider. Make sure you discuss any questions you have with your health care provider. Document Revised: 05/22/2020 Document Reviewed: 05/22/2020 Elsevier Patient Education  [place].

## 2021-02-27 ENCOUNTER — Encounter: Payer: Self-pay | Admitting: Internal Medicine

## 2021-02-27 ENCOUNTER — Other Ambulatory Visit: Payer: Self-pay

## 2021-02-27 ENCOUNTER — Ambulatory Visit: Payer: 59 | Admitting: Internal Medicine

## 2021-02-27 VITALS — BP 116/80 | HR 74 | Temp 98.3°F | Ht 63.0 in | Wt 212.8 lb

## 2021-02-27 DIAGNOSIS — Z6835 Body mass index (BMI) 35.0-35.9, adult: Secondary | ICD-10-CM

## 2021-02-27 DIAGNOSIS — K588 Other irritable bowel syndrome: Secondary | ICD-10-CM

## 2021-02-27 DIAGNOSIS — E66812 Obesity, class 2: Secondary | ICD-10-CM

## 2021-02-27 DIAGNOSIS — E559 Vitamin D deficiency, unspecified: Secondary | ICD-10-CM | POA: Diagnosis not present

## 2021-02-27 DIAGNOSIS — Z0000 Encounter for general adult medical examination without abnormal findings: Secondary | ICD-10-CM

## 2021-02-27 DIAGNOSIS — K580 Irritable bowel syndrome with diarrhea: Secondary | ICD-10-CM

## 2021-02-27 NOTE — Assessment & Plan Note (Signed)
Chronic Taking vitamin D daily Check vitamin D level  

## 2021-02-27 NOTE — Assessment & Plan Note (Signed)
Chronic Related to food and stress No current symptoms Has used bentyl in the past and it helps - can use if needed

## 2021-02-27 NOTE — Assessment & Plan Note (Signed)
Chronic Walking some - will walk more when the weather gets nice

## 2021-02-28 LAB — CBC WITH DIFFERENTIAL/PLATELET
Basophils Absolute: 0 10*3/uL (ref 0.0–0.1)
Basophils Relative: 0.6 % (ref 0.0–3.0)
Eosinophils Absolute: 0.1 10*3/uL (ref 0.0–0.7)
Eosinophils Relative: 2.4 % (ref 0.0–5.0)
HCT: 39.5 % (ref 36.0–46.0)
Hemoglobin: 13 g/dL (ref 12.0–15.0)
Lymphocytes Relative: 34.1 % (ref 12.0–46.0)
Lymphs Abs: 2.1 10*3/uL (ref 0.7–4.0)
MCHC: 32.9 g/dL (ref 30.0–36.0)
MCV: 77 fl — ABNORMAL LOW (ref 78.0–100.0)
Monocytes Absolute: 0.3 10*3/uL (ref 0.1–1.0)
Monocytes Relative: 4.9 % (ref 3.0–12.0)
Neutro Abs: 3.6 10*3/uL (ref 1.4–7.7)
Neutrophils Relative %: 58 % (ref 43.0–77.0)
Platelets: 211 10*3/uL (ref 150.0–400.0)
RBC: 5.13 Mil/uL — ABNORMAL HIGH (ref 3.87–5.11)
RDW: 15.8 % — ABNORMAL HIGH (ref 11.5–15.5)
WBC: 6.2 10*3/uL (ref 4.0–10.5)

## 2021-02-28 LAB — COMPREHENSIVE METABOLIC PANEL
ALT: 31 U/L (ref 0–35)
AST: 23 U/L (ref 0–37)
Albumin: 4.5 g/dL (ref 3.5–5.2)
Alkaline Phosphatase: 72 U/L (ref 39–117)
BUN: 13 mg/dL (ref 6–23)
CO2: 23 mEq/L (ref 19–32)
Calcium: 9.2 mg/dL (ref 8.4–10.5)
Chloride: 107 mEq/L (ref 96–112)
Creatinine, Ser: 0.7 mg/dL (ref 0.40–1.20)
GFR: 115.35 mL/min (ref >60.00)
Glucose, Bld: 77 mg/dL (ref 70–99)
Potassium: 4.4 mEq/L (ref 3.5–5.1)
Sodium: 139 mEq/L (ref 135–145)
Total Bilirubin: 0.6 mg/dL (ref 0.2–1.2)
Total Protein: 7.5 g/dL (ref 6.0–8.3)

## 2021-02-28 LAB — VITAMIN D 25 HYDROXY (VIT D DEFICIENCY, FRACTURES): VITD: 23.06 ng/mL — ABNORMAL LOW (ref 30.00–100.00)

## 2021-02-28 LAB — LIPID PANEL
Cholesterol: 145 mg/dL (ref 0–200)
HDL: 55.9 mg/dL (ref >39.00)
LDL Cholesterol: 76 mg/dL (ref 0–99)
NonHDL: 89.08
Total CHOL/HDL Ratio: 3
Triglycerides: 66 mg/dL (ref 0.0–149.0)
VLDL: 13.2 mg/dL (ref 0.0–40.0)

## 2021-02-28 LAB — TSH: TSH: 1.91 u[IU]/mL (ref 0.35–5.50)

## 2021-03-18 ENCOUNTER — Telehealth: Payer: BC Managed Care – PPO | Admitting: Family

## 2021-03-18 DIAGNOSIS — H109 Unspecified conjunctivitis: Secondary | ICD-10-CM

## 2021-03-18 MED ORDER — POLYMYXIN B-TRIMETHOPRIM 10000-0.1 UNIT/ML-% OP SOLN: 1.0000 [drp] | Freq: Four times a day (QID) | OPHTHALMIC | 0 refills | Status: DC

## 2021-03-18 NOTE — Progress Notes (Signed)

## 2021-04-03 ENCOUNTER — Encounter: Payer: Self-pay | Admitting: Internal Medicine

## 2021-04-04 ENCOUNTER — Encounter: Payer: Self-pay | Admitting: Family Medicine

## 2021-04-04 ENCOUNTER — Ambulatory Visit: Payer: BC Managed Care – PPO | Admitting: Family Medicine

## 2021-04-04 ENCOUNTER — Other Ambulatory Visit: Payer: Self-pay

## 2021-04-04 VITALS — BP 114/62 | HR 71 | Temp 97.8°F | Ht 63.0 in | Wt 215.6 lb

## 2021-04-04 DIAGNOSIS — R051 Acute cough: Secondary | ICD-10-CM

## 2021-04-04 DIAGNOSIS — J019 Acute sinusitis, unspecified: Secondary | ICD-10-CM | POA: Diagnosis not present

## 2021-04-04 MED ORDER — AMOXICILLIN 875 MG PO TABS: 875.0000 mg | ORAL_TABLET | Freq: Two times a day (BID) | ORAL | 0 refills | Status: AC

## 2021-04-04 MED ORDER — BENZONATATE 200 MG PO CAPS: 200.0000 mg | ORAL_CAPSULE | Freq: Two times a day (BID) | ORAL | 0 refills | Status: DC | PRN

## 2021-04-04 NOTE — Patient Instructions (Signed)
Take the antibiotic as prescribed until finished.  ? ?Follow up if not back to baseline or if you are getting worse.  ? ? ?Specific home care recommendations today include: ?Only take over-the-counter (OTC) or prescription medicines for pain, discomfort, or fever as directed by your caregiver.   ?Decongestant: You may use OTC Guaifenesin (Mucinex plain) for congestion.  You may use Pseudoephedrine (Sudafed) only if you don't have blood pressure problems or a diagnosis of hypertension. ?Cough suppression: If you have cough from drainage, you may use over-the-counter Dextromethorphan (Delsym) as directed on the label ?Sore throat remedies:  You may use salt water gargles, warm fluids such as coffee or hot tea, or honey/tea/lemon mixture to sooth sore throat pain.  You may use OTC sore throat remedies such as Cepacol lozenges or Chloraseptic spray for sore throat pain. ?Runny nose and sneezing remedies: You may use OTC antihistamine such as Zyrtec or Benadryl, but caution as these can cause drowsiness.   ?Pain/fever relief: You may use over-the-counter Tylenol for pain or fever ?Drink extra fluids. Fluids help thin the mucus so your sinuses can drain more easily.  ?Applying either moist heat or ice packs to the sinus areas may help relieve discomfort. ?Use saline nasal sprays to help moisten your sinuses. The sprays can be found at your local drugstore.  ?

## 2021-04-04 NOTE — Progress Notes (Signed)
?Subjective: ? Jenna Lane is a 31 y.o. female who presents for an acute illness.   ? ?She is here with complaints of a 2 week history of URI symptoms which started with pink eye and progressed to sore throat, nasal congestion ,post nasal drainage and a productive cough (yellow-Stonesifer sputum).  ?States the pink eye has resolved. She has a 13 month old child in daycare.  ? ?Denies fever, chills, headache, ear pain, chest pain, palpitations, shortness of breath, abdominal pain, N/V/D.  ? ? ?Taking OTC multi-symptom cold medication  ? ?She is not breastfeeding.  ? ?Immunocompetent.  ?Does not smoke.  ?No recent antibiotics.  ? ? ?Past Medical History:  ?Diagnosis Date  ? Abnormal Pap smear of cervix 03/2009  ? h/o AGUS pap  ? Acid reflux   ? pt reported with pregnancy  ? Asthma   ? as child-no medications  ? Concussion 11/04/2015  ? speaker fell on her head  ? Headache(784.0)   ? quite a few years ago  ? 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 IBS (irritable bowel syndrome)   ? MRSA infection 2009  ? on skin  ? Right ankle injury   ? Vitamin D deficiency   ? ? ?ROS as in subjective ? ? ?Objective: ?BP 114/62 (BP Location: Left Arm, Patient Position: Sitting, Cuff Size: Large)   Pulse 71   Temp 97.8 ?F (36.6 ?C) (Oral)   Ht '5\' 3"'$  (1.6 m)   Wt 215 lb 9.6 oz (97.8 kg)   SpO2 99%   Breastfeeding No   BMI 38.19 kg/m?  ? ?General appearance: Alert, WD/WN, no distress, mildly ill appearing ?                            Skin: warm, no rash ?                          Head: no sinus tenderness ?                           Eyes: conjunctiva normal, corneas clear, PERRLA ?                           Ears: pearly TMs, external ear canals normal ?                         Nose: septum midline, turbinates swollen, with erythema and clear discharge ?            Mouth/throat: MMM, tongue normal, mild pharyngeal erythema ?                          Neck: supple, no adenopathy, no thyromegaly, non tender ?                         Heart: RRR, normal  S1, S2, no murmurs ?                        Lungs: CTA bilaterally, no wheezes, rales, or rhonchi ? ?    ?Assessment  ?Encounter Diagnoses  ?Name Primary?  ? Acute sinusitis with symptoms > 10 days Yes  ? Acute cough   ? ? ?  ?Plan: ?Medications prescribed today: ?Amoxicillin  and Tessalon Perles ? ? ?Specific home care recommendations today include: ?Only take over-the-counter (OTC) or prescription medicines for pain, discomfort, or fever as directed by your caregiver.   ?Decongestant: You may use OTC Guaifenesin (Mucinex plain) for congestion.  You may use Pseudoephedrine (Sudafed) only if you don't have blood pressure problems or a diagnosis of hypertension. ?Cough suppression: If you have cough from drainage, you may use over-the-counter Dextromethorphan (Delsym) as directed on the label ?Sore throat remedies:  You may use salt water gargles, warm fluids such as coffee or hot tea, or honey/tea/lemon mixture to sooth sore throat pain.  You may use OTC sore throat remedies such as Cepacol lozenges or Chloraseptic spray for sore throat pain. ?Runny nose and sneezing remedies: You may use OTC antihistamine such as Zyrtec or Benadryl, but caution as these can cause drowsiness.   ?Pain/fever relief: You may use over-the-counter Tylenol for pain or fever ?Drink extra fluids. Fluids help thin the mucus so your sinuses can drain more easily.  ?Applying either moist heat or ice packs to the sinus areas may help relieve discomfort. ?Use saline nasal sprays to help moisten your sinuses. The sprays can be found at your local drugstore.  ? ?Patient was advised to call or return if worse or not improving in the next few days.   ? ?Patient voiced understanding of diagnosis, recommendations, and treatment plan. ? ?After visit summary given.  ? ?

## 2021-08-21 ENCOUNTER — Encounter: Payer: Self-pay | Admitting: Internal Medicine

## 2021-08-22 ENCOUNTER — Encounter (INDEPENDENT_AMBULATORY_CARE_PROVIDER_SITE_OTHER): Payer: Self-pay

## 2021-08-22 ENCOUNTER — Other Ambulatory Visit: Payer: Self-pay

## 2021-08-22 MED ORDER — SERTRALINE HCL 50 MG PO TABS: 50.0000 mg | ORAL_TABLET | Freq: Every day | ORAL | 3 refills | Status: DC

## 2021-08-29 ENCOUNTER — Encounter: Payer: Self-pay | Admitting: Internal Medicine

## 2021-08-30 ENCOUNTER — Ambulatory Visit: Payer: BC Managed Care – PPO | Admitting: Family Medicine

## 2021-08-30 ENCOUNTER — Encounter: Payer: Self-pay | Admitting: Family Medicine

## 2021-08-30 VITALS — BP 110/76 | HR 55 | Temp 97.6°F | Ht 63.0 in | Wt 208.0 lb

## 2021-08-30 DIAGNOSIS — R3915 Urgency of urination: Secondary | ICD-10-CM | POA: Diagnosis not present

## 2021-08-30 DIAGNOSIS — R300 Dysuria: Secondary | ICD-10-CM

## 2021-08-30 LAB — POCT URINALYSIS DIPSTICK
Bilirubin, UA: NEGATIVE
Blood, UA: NEGATIVE
Glucose, UA: NEGATIVE
Ketones, UA: NEGATIVE
Leukocytes, UA: NEGATIVE
Nitrite, UA: NEGATIVE
Protein, UA: NEGATIVE
Spec Grav, UA: >=1.03 — AB (ref 1.010–1.025)
Urobilinogen, UA: 1 E.U./dL
pH, UA: 6 (ref 5.0–8.0)

## 2021-08-30 NOTE — Patient Instructions (Signed)
Continue with good hydration.   You may take Tylenol or try over the counter AZO today and tomorrow.   We will be in touch with your urine culture result.

## 2021-08-30 NOTE — Progress Notes (Signed)
Subjective:  Jenna Lane is a 31 y.o. female who complains of possible urinary tract infection.  She has had symptoms for 1 day.  Symptoms include  dysuria and urgency . Patient denies  fever, chills, abdominal pain, back pain, N/V/D .  Last UTI was years.   Using nothing for current symptoms.    Patient does not have a history of recurrent UTI. Patient does not have a history of pyelonephritis.  No other aggravating or relieving factors.  No other c/o.  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    Objective: Vitals:   08/30/21 0845  BP: 110/76  Pulse: (!) 55  Temp: 97.6 F (36.4 C)  SpO2: 98%    General appearance: alert, no distress, WD/WN, female Cardiac: RRR Lungs: CTA Abdomen: soft, non tender, non distended Back: no CVA tenderness GU: deferred      Laboratory:  Urine dipstick: sp gravity 1.030, negative for glucose, negative for hemoglobin, negative for leukocyte esterase, negative for nitrites, and trace for urobilinogen.       Assessment: Dysuria - Plan: POCT urinalysis dipstick, Urine Culture, Urine Culture  Urinary urgency - Plan: POCT urinalysis dipstick, Urine Culture, Urine Culture   Plan: Discussed symptoms, diagnosis, possible complications, and usual course of illness.  She will continue with hydration and may take Tylenol or AZO today and tomorrow. Urine culture sent   Call or return if worse or not improving.

## 2021-08-31 LAB — URINE CULTURE: Result:: NO GROWTH

## 2021-09-06 ENCOUNTER — Encounter: Payer: Self-pay | Admitting: Family Medicine

## 2021-09-06 ENCOUNTER — Ambulatory Visit: Payer: BC Managed Care – PPO | Admitting: Family Medicine

## 2021-09-06 ENCOUNTER — Ambulatory Visit: Payer: Self-pay

## 2021-09-06 VITALS — BP 112/74 | HR 70 | Ht 63.0 in

## 2021-09-06 DIAGNOSIS — M25512 Pain in left shoulder: Secondary | ICD-10-CM | POA: Diagnosis not present

## 2021-09-06 DIAGNOSIS — M25519 Pain in unspecified shoulder: Secondary | ICD-10-CM | POA: Insufficient documentation

## 2021-09-06 NOTE — Progress Notes (Signed)
[facility] [place] Phone: [phone] Subjective:   Jenna Jenna Lane, am serving as a scribe for Dr. Hulan Jenna Lane.  I'm seeing this patient by the request  of:  Jenna Rail, MD  CC: Back and shoulder pain  [id number]  Jenna Jenna Lane is a 31 y.o. female coming in with complaint of back and L shoulder pain. Patient states that she is having pain over top of shoulder and L sided neck pain. 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 Denies any radiating symptoms. Using Tylenol and IBU prn.            Reviewed prior external information including notes and imaging from previsou exam, outside providers and external EMR if available.   As well as notes that were available from care everywhere and other healthcare systems.  Past medical history, social, surgical and family history all reviewed in electronic medical record.  Jenna Lane pertanent information unless stated regarding to the chief complaint.   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 POSITIVE muscle aches  Objective  Blood pressure 112/74, pulse 70, height '5\' 3"'$  (1.6 m), peak flow 98 L/min, not currently breastfeeding.   General: Jenna Lane apparent  distress alert and oriented x3 mood and affect normal, dressed appropriately.  HEENT: Pupils equal, extraocular movements intact  Respiratory: Patient's speak in full sentences and does not appear short of breath  Cardiovascular: Jenna Lane lower extremity edema, non tender, Jenna Lane erythema  Gait MSK:  Back patient's shoulder and does have positive crossover noted.  Tender to palpation to noted over the acromioclavicular joint as well.  5 out of 5 strength of the rotator cuff.  Procedure: Real-time Ultrasound Guided Injection of left acromioclavicular joint Device: GE Logiq Q7 Ultrasound guided injection is preferred based studies that show increased duration, increased effect, greater accuracy, decreased procedural pain, increased response rate, and decreased cost with ultrasound guided versus blind injection.  Verbal informed consent obtained.  Time-out conducted.  Noted Jenna Lane overlying erythema, induration, or other signs of local infection.  Skin prepped in a sterile fashion.  Local anesthesia: Topical Ethyl chloride.  With sterile technique and under real time ultrasound guidance: With a 25-gauge half inch needle injecting 0.5 cc of 0.5% Marcaine and 0.5 cc of Kenalog 40 mg/mL Completed without difficulty  Advised to call if fevers/chills, erythema, induration, drainage, or persistent bleeding.  Impression: Technically successful ultrasound guided injection.      Assessment and Plan:  AC joint pain Small effusion noted and given injection.  Patient's symptoms are consistent with that as well.  Discussed icing regimen and home exercises, which activities to do which  ones to avoid.  Follow-up again in 6 to 8 weeks otherwise.      The above documentation has been reviewed and is accurate and complete Jenna Pulley, DO          Note: This dictation was prepared with Dragon dictation along with smaller phrase technology. Any transcriptional errors that result from this process are  unintentional.

## 2021-09-06 NOTE — Assessment & Plan Note (Signed)
Small effusion noted and given injection.  Patient's symptoms are consistent with that as well.  Discussed icing regimen and home exercises, which activities to do which ones to avoid.  Follow-up again in 6 to 8 weeks otherwise.

## 2021-09-07 ENCOUNTER — Ambulatory Visit: Payer: BC Managed Care – PPO | Admitting: Family Medicine

## 2021-10-24 DIAGNOSIS — L209 Atopic dermatitis, unspecified: Secondary | ICD-10-CM | POA: Diagnosis not present

## 2021-10-24 DIAGNOSIS — L299 Pruritus, unspecified: Secondary | ICD-10-CM | POA: Diagnosis not present

## 2021-10-24 DIAGNOSIS — D225 Melanocytic nevi of trunk: Secondary | ICD-10-CM | POA: Diagnosis not present

## 2021-11-15 ENCOUNTER — Ambulatory Visit: Payer: BC Managed Care – PPO | Admitting: Family Medicine

## 2021-11-15 ENCOUNTER — Encounter: Payer: Self-pay | Admitting: Family Medicine

## 2021-11-15 VITALS — BP 114/78 | HR 85 | Temp 97.6°F | Ht 63.0 in

## 2021-11-15 DIAGNOSIS — J019 Acute sinusitis, unspecified: Secondary | ICD-10-CM

## 2021-11-15 MED ORDER — AMOXICILLIN 875 MG PO TABS: 875.0000 mg | ORAL_TABLET | Freq: Two times a day (BID) | ORAL | 0 refills | Status: AC

## 2021-11-15 NOTE — Progress Notes (Signed)
Subjective:  Jenna Lane is a 31 y.o. female who presents for possible sinus infection.  Symptoms include a 4 wk hx of worsening nasal congestion, purulent drainage and sinus pain.  Denies fever, chills, dizziness, ear pain, sore throat, chest pain, shortness of breath, N/V/D.    ROS as in subjective   Objective: Vitals:   11/15/21 1420  BP: 114/78  Pulse: 85  Temp: 97.6 F (36.4 C)  SpO2: 97%    General appearance: Alert, WD/WN, no distress                             Skin: warm, no rash                           Head: no sinus tenderness,                            Eyes: conjunctiva normal, corneas clear, PERRLA                            Ears: pearly TMs, external ear canals normal                          Nose: septum midline, turbinates swollen             Mouth/throat: MMM, tongue normal, mild pharyngeal erythema                           Neck: supple, no adenopathy, no thyromegaly, nontender                          Heart: RRR                         Lungs: CTA bilaterally, no wheezes, rales, or rhonchi       Assessment and Plan:  Acute sinusitis with symptoms > 10 days - Plan: amoxicillin (AMOXIL) 875 MG tablet   Amoxicillin prescribed. Continue OTC symptomatic treatment. Follow up if not improving

## 2022-01-02 ENCOUNTER — Encounter: Payer: Self-pay | Admitting: Internal Medicine

## 2022-01-30 ENCOUNTER — Encounter: Payer: Self-pay | Admitting: Internal Medicine

## 2022-02-02 MED ORDER — ALPRAZOLAM 0.5 MG PO TABS: ORAL_TABLET | ORAL | 0 refills | Status: AC

## 2022-02-20 ENCOUNTER — Encounter: Payer: Self-pay | Admitting: Internal Medicine

## 2022-02-21 ENCOUNTER — Telehealth (INDEPENDENT_AMBULATORY_CARE_PROVIDER_SITE_OTHER): Payer: BC Managed Care – PPO | Admitting: Family Medicine

## 2022-02-21 DIAGNOSIS — R52 Pain, unspecified: Secondary | ICD-10-CM

## 2022-02-21 DIAGNOSIS — U071 COVID-19: Secondary | ICD-10-CM | POA: Diagnosis not present

## 2022-02-21 MED ORDER — NIRMATRELVIR/RITONAVIR (PAXLOVID)TABLET: 3.0000 | ORAL_TABLET | Freq: Two times a day (BID) | ORAL | 0 refills | Status: AC

## 2022-02-21 NOTE — Progress Notes (Signed)
MyChart Video Visit    Virtual Visit via Video Note   This visit type was conducted due to national recommendations for restrictions regarding the COVID-19 Pandemic (e.g. social distancing) in an effort to limit this patient's exposure and mitigate transmission in our community. This patient is at least at moderate risk for complications without adequate follow up. This format is felt to be most appropriate for this patient at this time. Physical exam was limited by quality of the video and audio technology used for the visit. CMA was able to get the patient set up on a video visit.  Patient location: Home. Patient and provider in visit Provider location: Office  I discussed the limitations of evaluation and management by telemedicine and the availability of in person appointments. The patient expressed understanding and agreed to proceed.  Visit Date: 02/21/2022  Today's healthcare provider: Harland Dingwall, NP-C     Subjective:    Patient ID: Jenna Lane, female    DOB: 11-29-1990, 32 y.o.   MRN: [id number]  Chief Complaint  Patient presents with   Covid Positive    HPI States she tested positive for Covid at home yesterday.  Symptom onset yesterday.   States her son has Covid also.   C/o severe body aches, headache, mild URI symptoms.   Taking Tylenol. Staying hydrated.   Denies fever, cough, chest pain, palpitations, shortness of breath, N/V/D  Hx of Covid 2 years ago.  Last Covid vaccine 2 years ago.   Has Nuvaring. LMP: last week.    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   IBS (irritable bowel syndrome)    MRSA infection 2009   on skin   Right ankle injury    Vitamin D deficiency     Past  Surgical History:  Procedure Laterality Date   CESAREAN SECTION N/A 11/15/2020   Procedure: PRIMARY CESAREAN SECTION EDC: 11-21-20 ALLERG: LSLHT;  Surgeon: Tyson Dense, MD;  Location: [place] [facility];  Service: Obstetrics;  Laterality: N/A;   COLPOSCOPY  2011   skin cyst excision     left axillary   TONSILLECTOMY AND ADENOIDECTOMY N/A 01/02/2013   Procedure: TONSILLECTOMY ;  Surgeon: Jodi Marble, MD;  Location: [facility] OR;  Service: ENT;  Laterality: N/A;   WISDOM TOOTH EXTRACTION      Family History  Problem Relation Age of Onset   Hyperlipidemia Father    Hypertension Father        ?   Depression Maternal Uncle    Lung cancer Maternal Grandfather    Heart disease Paternal Grandmother    Heart disease Paternal Grandfather     Social History   Socioeconomic History   Marital status: Married    Spouse name: Lynnet Hefley   Number of children: 0   Years of education: Associates   Highest education level: Not on file  Occupational History   Occupation: Biochemist, clinical- xray tech  Tobacco Use   Smoking status: Never   Smokeless tobacco: Never  Vaping Use   Vaping Use: Never used  Substance and Sexual Activity   Alcohol use: Not Currently    Alcohol/week: 2.0 standard drinks of  alcohol    Types: 2 Standard drinks or equivalent per week   Drug use: No   Sexual activity: Yes    Partners: Male    Birth control/protection: Inserts    Comment: nuvaring  Other Topics Concern   Not on file  Social History Narrative   Lives alone   Caffeine use: 1 cup daily   Right-handed   Social Determinants of Health   Financial Resource Strain: Not on file  Food Insecurity: Not on file  Transportation Needs: Not on file  Physical Activity: Not on file  Stress: Not on file  Social Connections: Not on file  Intimate Partner Violence: Not on file    Outpatient Medications Prior to Visit  Medication Sig Dispense Refill   ALPRAZolam (XANAX) 0.5 MG tablet Take 0.25-0.5 mg  30-60 minutes prior to dental appointment (Patient not taking: Reported on 02/21/2022) 10 tablet 0   NUVARING 0.12-0.015 MG/24HR vaginal ring Place 1 each vaginally every 30 (thirty) days.     sertraline (ZOLOFT) 50 MG tablet Take 1 tablet (50 mg total) by mouth daily. 30 tablet 3   No facility-administered medications prior to visit.    Allergies  Allergen Reactions   [name] [Drospirenone-Ethinyl Estradiol] Rash    ROS     Objective:    Physical Exam  There were no vitals taken for this visit. Wt Readings from Last 3 Encounters:  08/30/21 208 lb (94.3 kg)  04/04/21 215 lb 9.6 oz (97.8 kg)  02/27/21 212 lb 12.8 oz (96.5 kg)   Alert and oriented and in no acute distress.  Respirations unlabored.  Normal speech and mood.    Assessment & Plan:   Problem List Items Addressed This Visit   None Visit Diagnoses     COVID-19 virus infection    -  Primary   Relevant Medications   nirmatrelvir/ritonavir (PAXLOVID) 20 x 150 MG & 10 x '100MG'$  TABS   Body aches          Counseling on symptomatic management.  She is immunocompetent and I expect that she will bounce back fairly quickly.  She would like to take Paxlovid if she is not feeling better after today.  Discussed that she is not high risk for hospitalization related to COVID but I will prescribe the medication in case she gets worse today.  Counseling on how to take the medication and potential side effects.  Counseling on guidelines for quarantine and isolation.  I am having Jenna Lane start on nirmatrelvir/ritonavir. I am also having her maintain her NuvaRing, sertraline, and ALPRAZolam.  Meds ordered this encounter  Medications   nirmatrelvir/ritonavir (PAXLOVID) 20 x 150 MG & 10 x '100MG'$  TABS    Sig: Take 3 tablets by mouth 2 (two) times daily for 5 days. (Take nirmatrelvir 150 mg two tablets twice daily for 5 days and ritonavir 100 mg one tablet twice daily for 5 days) Patient GFR is >60    Dispense:  30 tablet    Refill:   0    Order Specific Question:   Supervising Provider    Answer:   Pricilla Holm A [[id number]]    I discussed the assessment and treatment plan with the patient. The patient was provided an opportunity to ask questions and all were answered. The patient agreed with the plan and demonstrated an understanding of the instructions.   The patient was advised to call back or seek an in-person evaluation if the symptoms worsen or if the condition fails to  improve as anticipated.  I provided 16 minutes of face-to-face time during this encounter.   Harland Dingwall, NP-C [facility] [phone] (phone) [phone] (fax)  [place]

## 2022-03-03 ENCOUNTER — Encounter: Payer: Self-pay | Admitting: Internal Medicine

## 2022-03-03 NOTE — Patient Instructions (Addendum)
Blood work was ordered.   The lab is on the first floor.    Medications changes include : None      Return in about 1 year (around 03/05/2023) for Physical Exam.   Health Maintenance, Female Adopting a healthy lifestyle and getting preventive care are important in promoting health and wellness. Ask your health care provider about: The right schedule for you to have regular tests and exams. Things you can do on your own to prevent diseases and keep yourself healthy. What should I know about diet, weight, and exercise? Eat a healthy diet  Eat a diet that includes plenty of vegetables, fruits, low-fat dairy products, and lean protein. Do not eat a lot of foods that are high in solid fats, added sugars, or sodium. Maintain a healthy weight Body mass index (BMI) is used to identify weight problems. It estimates body fat based on height and weight. Your health care provider can help determine your BMI and help you achieve or maintain a healthy weight. Get regular exercise Get regular exercise. This is one of the most important things you can do for your health. Most adults should: Exercise for at least 150 minutes each week. The exercise should increase your heart rate and make you sweat (moderate-intensity exercise). Do strengthening exercises at least twice a week. This is in addition to the moderate-intensity exercise. Spend less time sitting. Even light physical activity can be beneficial. Watch cholesterol and blood lipids Have your blood tested for lipids and cholesterol at 33 years of age, then have this test every 5 years. Have your cholesterol levels checked more often if: Your lipid or cholesterol levels are high. You are older than 33 years of age. You are at high risk for heart disease. What should I know about cancer screening? Depending on your health history and family history, you may need to have cancer screening at various ages. This may include screening  for: Breast cancer. Cervical cancer. Colorectal cancer. Skin cancer. Lung cancer. What should I know about heart disease, diabetes, and high blood pressure? Blood pressure and heart disease High blood pressure causes heart disease and increases the risk of stroke. This is more likely to develop in people who have high blood pressure readings or are overweight. Have your blood pressure checked: Every 3-5 years if you are 42-108 years of age. Every year if you are 17 years old or older. Diabetes Have regular diabetes screenings. This checks your fasting blood sugar level. Have the screening done: Once every three years after age 23 if you are at a normal weight and have a low risk for diabetes. More often and at a younger age if you are overweight or have a high risk for diabetes. What should I know about preventing infection? Hepatitis B If you have a higher risk for hepatitis B, you should be screened for this virus. Talk with your health care provider to find out if you are at risk for hepatitis B infection. Hepatitis C Testing is recommended for: Everyone born from [birth year] through 1966. Anyone with known risk factors for hepatitis C. Sexually transmitted infections (STIs) Get screened for STIs, including gonorrhea and chlamydia, if: You are sexually active and are younger than 33 years of age. You are older than 33 years of age and your health care provider tells you that you are at risk for this type of infection. Your sexual activity has changed since you were last screened, and you are at increased  risk for chlamydia or gonorrhea. Ask your health care provider if you are at risk. Ask your health care provider about whether you are at high risk for HIV. Your health care provider may recommend a prescription medicine to help prevent HIV infection. If you choose to take medicine to prevent HIV, you should first get tested for HIV. You should then be tested every 3 months for as long as you  are taking the medicine. Pregnancy If you are about to stop having your period (premenopausal) and you may become pregnant, seek counseling before you get pregnant. Take 400 to 800 micrograms (mcg) of folic acid every day if you become pregnant. Ask for birth control (contraception) if you want to prevent pregnancy. Osteoporosis and menopause Osteoporosis is a disease in which the bones lose minerals and strength with aging. This can result in bone fractures. If you are 18 years old or older, or if you are at risk for osteoporosis and fractures, ask your health care provider if you should: Be screened for bone loss. Take a calcium or vitamin D supplement to lower your risk of fractures. Be given hormone replacement therapy (HRT) to treat symptoms of menopause. Follow these instructions at home: Alcohol use Do not drink alcohol if: Your health care provider tells you not to drink. You are pregnant, may be pregnant, or are planning to become pregnant. If you drink alcohol: Limit how much you have to: 0-1 drink a day. Know how much alcohol is in your drink. In the [place] one drink equals one 12 oz bottle of beer (355 mL), one 5 oz glass of wine (148 mL), or one 1 oz glass of hard liquor (44 mL). Lifestyle Do not use any products that contain nicotine or tobacco. These products include cigarettes, chewing tobacco, and vaping devices, such as e-cigarettes. If you need help quitting, ask your health care provider. Do not use street drugs. Do not share needles. Ask your health care provider for help if you need support or information about quitting drugs. General instructions Schedule regular health, dental, and eye exams. Stay current with your vaccines. Tell your health care provider if: You often feel depressed. You have ever been abused or do not feel safe at home. Summary Adopting a healthy lifestyle and getting preventive care are important in promoting health and wellness. Follow your  health care provider's instructions about healthy diet, exercising, and getting tested or screened for diseases. Follow your health care provider's instructions on monitoring your cholesterol and blood pressure. This information is not intended to replace advice given to you by your health care provider. Make sure you discuss any questions you have with your health care provider. Document Revised: 05/22/2020 Document Reviewed: 05/22/2020 Elsevier Patient Education  2023 [employer].

## 2022-03-03 NOTE — Progress Notes (Unsigned)
Subjective:    Patient ID: Jenna Lane, female    DOB: 1990/08/22, 32 y.o.   MRN: [id number]      HPI Jenna Lane is here for a Physical exam.   Doing weight watchers - losing weight.   Dong well - no concerns   Medications and allergies reviewed with patient and updated if appropriate.  Current Outpatient Medications on File Prior to Visit  Medication Sig Dispense Refill   ALPRAZolam (XANAX) 0.5 MG tablet Take 0.25-0.5 mg 30-60 minutes prior to dental appointment (Patient not taking: Reported on 03/04/2022) 10 tablet 0   Multiple Vitamin (MULTIVITAMIN) capsule Take 1 capsule by mouth daily.     NUVARING 0.12-0.015 MG/24HR vaginal ring Place 1 each vaginally every 30 (thirty) days.     sertraline (ZOLOFT) 50 MG tablet Take 1 tablet (50 mg total) by mouth daily. 30 tablet 3   No current facility-administered medications on file prior to visit.    Review of Systems  Constitutional:  Negative for fever.  Eyes:  Negative for visual disturbance.  Respiratory:  Negative for cough, shortness of breath and wheezing.   Cardiovascular:  Negative for chest pain, palpitations and leg swelling.  Gastrointestinal:  Negative for abdominal pain, blood in stool, constipation and diarrhea.       No gerd  Genitourinary:  Negative for dysuria.  Musculoskeletal:  Negative for arthralgias and back pain.  Skin:  Negative for rash.  Neurological:  Negative for light-headedness and headaches.  Psychiatric/Behavioral:  Negative for dysphoric mood. The patient is not nervous/anxious.        Objective:   Vitals:   03/04/22 0749  BP: 106/72  Pulse: 62  Temp: 98.1 F (36.7 C)  SpO2: 98%   Filed Weights   03/04/22 0749  Weight: 201 lb (91.2 kg)   Body mass index is 35.61 kg/m.  BP Readings from Last 3 Encounters:  03/04/22 106/72  11/15/21 114/78  09/06/21 112/74    Wt Readings from Last 3 Encounters:  03/04/22 201 lb (91.2 kg)  08/30/21 208 lb (94.3 kg)  04/04/21 215 lb 9.6 oz  (97.8 kg)       Physical Exam Constitutional: She appears well-developed and well-nourished. No distress.  HENT:  Head: Normocephalic and atraumatic.  Right Ear: External ear normal. Normal ear canal and TM Left Ear: External ear normal.  Normal ear canal and TM Mouth/Throat: Oropharynx is clear and moist.  Eyes: Conjunctivae normal.  Neck: Neck supple. No tracheal deviation present. No thyromegaly present.  No carotid bruit  Cardiovascular: Normal rate, regular rhythm and normal heart sounds.   No murmur heard.  No edema. Pulmonary/Chest: Effort normal and breath sounds normal. No respiratory distress. She has no wheezes. She has no rales.  Breast: deferred   Abdominal: Soft. She exhibits no distension. There is no tenderness.  Lymphadenopathy: She has no cervical adenopathy.  Skin: Skin is warm and dry. She is not diaphoretic.  Psychiatric: She has a normal mood and affect. Her behavior is normal.     Lab Results  Component Value Date   WBC 6.2 02/28/2021   HGB 13.0 02/28/2021   HCT 39.5 02/28/2021   PLT 211.0 02/28/2021   GLUCOSE 77 02/28/2021   CHOL 145 02/28/2021   TRIG 66.0 02/28/2021   HDL 55.90 02/28/2021   LDLCALC 76 02/28/2021   ALT 31 02/28/2021   AST 23 02/28/2021   NA 139 02/28/2021   K 4.4 02/28/2021   CL 107 02/28/2021   CREATININE  0.70 02/28/2021   BUN 13 02/28/2021   CO2 23 02/28/2021   TSH 1.91 02/28/2021   HGBA1C 4.8 01/20/2020         Assessment & Plan:   Physical exam: Screening blood work  ordered Exercise  not regularly  Weight   - working on weight loss   Substance abuse  none   Reviewed recommended immunizations.   Health Maintenance  Topic Date Due   PAP SMEAR-Modifier  06/26/2021   COVID-19 Vaccine (4 - 2023-24 season) 09/14/2021   DTaP/Tdap/Td (3 - Td or Tdap) 08/07/2027   INFLUENZA VACCINE  Completed   Hepatitis C Screening  Completed   HIV Screening  Completed   HPV VACCINES  Aged Out          See Problem  List for Assessment and Plan of chronic medical problems.

## 2022-03-04 ENCOUNTER — Ambulatory Visit (INDEPENDENT_AMBULATORY_CARE_PROVIDER_SITE_OTHER): Payer: BC Managed Care – PPO | Admitting: Internal Medicine

## 2022-03-04 VITALS — BP 106/72 | HR 62 | Temp 98.1°F | Ht 63.0 in | Wt 201.0 lb

## 2022-03-04 DIAGNOSIS — F32A Depression, unspecified: Secondary | ICD-10-CM

## 2022-03-04 DIAGNOSIS — E559 Vitamin D deficiency, unspecified: Secondary | ICD-10-CM

## 2022-03-04 DIAGNOSIS — Z0000 Encounter for general adult medical examination without abnormal findings: Secondary | ICD-10-CM

## 2022-03-04 DIAGNOSIS — Z6835 Body mass index (BMI) 35.0-35.9, adult: Secondary | ICD-10-CM | POA: Diagnosis not present

## 2022-03-04 DIAGNOSIS — K580 Irritable bowel syndrome with diarrhea: Secondary | ICD-10-CM

## 2022-03-04 DIAGNOSIS — F419 Anxiety disorder, unspecified: Secondary | ICD-10-CM

## 2022-03-04 NOTE — Assessment & Plan Note (Signed)
Chronic Encouraged regular exercise Discussed healthy diet

## 2022-03-04 NOTE — Assessment & Plan Note (Signed)
Chronic Check vitamin D level 

## 2022-03-04 NOTE — Assessment & Plan Note (Signed)
Chronic Controlled, stable Continue sertraline 50 mg daily

## 2022-03-05 LAB — COMPREHENSIVE METABOLIC PANEL
ALT: 22 U/L (ref 0–35)
AST: 18 U/L (ref 0–37)
Albumin: 4.3 g/dL (ref 3.5–5.2)
Alkaline Phosphatase: 47 U/L (ref 39–117)
BUN: 12 mg/dL (ref 6–23)
CO2: 24 mEq/L (ref 19–32)
Calcium: 9.4 mg/dL (ref 8.4–10.5)
Chloride: 107 mEq/L (ref 96–112)
Creatinine, Ser: 0.79 mg/dL (ref 0.40–1.20)
GFR: 99.06 mL/min (ref >60.00)
Glucose, Bld: 81 mg/dL (ref 70–99)
Potassium: 4.3 mEq/L (ref 3.5–5.1)
Sodium: 139 mEq/L (ref 135–145)
Total Bilirubin: 0.6 mg/dL (ref 0.2–1.2)
Total Protein: 7 g/dL (ref 6.0–8.3)

## 2022-03-05 LAB — LIPID PANEL
Cholesterol: 135 mg/dL (ref 0–200)
HDL: 50.1 mg/dL (ref >39.00)
LDL Cholesterol: 62 mg/dL (ref 0–99)
NonHDL: 85.2
Total CHOL/HDL Ratio: 3
Triglycerides: 114 mg/dL (ref 0.0–149.0)
VLDL: 22.8 mg/dL (ref 0.0–40.0)

## 2022-03-05 LAB — CBC WITH DIFFERENTIAL/PLATELET
Basophils Absolute: 0.1 10*3/uL (ref 0.0–0.1)
Basophils Relative: 0.7 % (ref 0.0–3.0)
Eosinophils Absolute: 0.2 10*3/uL (ref 0.0–0.7)
Eosinophils Relative: 2.2 % (ref 0.0–5.0)
HCT: 40.4 % (ref 36.0–46.0)
Hemoglobin: 14 g/dL (ref 12.0–15.0)
Lymphocytes Relative: 33.7 % (ref 12.0–46.0)
Lymphs Abs: 2.5 10*3/uL (ref 0.7–4.0)
MCHC: 34.5 g/dL (ref 30.0–36.0)
MCV: 83.3 fl (ref 78.0–100.0)
Monocytes Absolute: 0.3 10*3/uL (ref 0.1–1.0)
Monocytes Relative: 3.9 % (ref 3.0–12.0)
Neutro Abs: 4.5 10*3/uL (ref 1.4–7.7)
Neutrophils Relative %: 59.5 % (ref 43.0–77.0)
Platelets: 249 10*3/uL (ref 150.0–400.0)
RBC: 4.85 Mil/uL (ref 3.87–5.11)
RDW: 14 % (ref 11.5–15.5)
WBC: 7.6 10*3/uL (ref 4.0–10.5)

## 2022-03-05 LAB — TSH: TSH: 3.04 u[IU]/mL (ref 0.35–5.50)

## 2022-03-05 LAB — VITAMIN D 25 HYDROXY (VIT D DEFICIENCY, FRACTURES): VITD: 19.5 ng/mL — ABNORMAL LOW (ref 30.00–100.00)

## 2022-03-10 ENCOUNTER — Other Ambulatory Visit: Payer: Self-pay | Admitting: Internal Medicine

## 2022-03-20 ENCOUNTER — Encounter: Payer: Self-pay | Admitting: Internal Medicine

## 2022-03-20 NOTE — Progress Notes (Signed)
Outside notes received. Information abstracted. Notes sent to scan.  

## 2022-04-23 DIAGNOSIS — Z6833 Body mass index (BMI) 33.0-33.9, adult: Secondary | ICD-10-CM | POA: Diagnosis not present

## 2022-04-23 DIAGNOSIS — Z01419 Encounter for gynecological examination (general) (routine) without abnormal findings: Secondary | ICD-10-CM | POA: Diagnosis not present

## 2022-07-08 ENCOUNTER — Ambulatory Visit: Payer: BC Managed Care – PPO | Admitting: Family Medicine

## 2022-07-08 VITALS — BP 94/78 | HR 66 | Temp 98.1°F | Resp 20 | Ht 63.0 in | Wt 186.0 lb

## 2022-07-08 DIAGNOSIS — J988 Other specified respiratory disorders: Secondary | ICD-10-CM

## 2022-07-08 DIAGNOSIS — B9689 Other specified bacterial agents as the cause of diseases classified elsewhere: Secondary | ICD-10-CM

## 2022-07-08 MED ORDER — AMOXICILLIN-POT CLAVULANATE 875-125 MG PO TABS: 1.0000 | ORAL_TABLET | Freq: Two times a day (BID) | ORAL | 0 refills | Status: AC

## 2022-07-08 MED ORDER — BENZONATATE 100 MG PO CAPS
200.0000 mg | ORAL_CAPSULE | Freq: Three times a day (TID) | ORAL | 0 refills | Status: DC | PRN
Start: 2022-07-08 — End: 2023-03-25

## 2022-07-08 NOTE — Progress Notes (Signed)
Assessment & Plan:  1. Bacterial respiratory infection - amoxicillin-clavulanate (AUGMENTIN) 875-125 MG tablet; Take 1 tablet by mouth 2 (two) times daily for 7 days.  Dispense: 14 tablet; Refill: 0 - benzonatate (TESSALON PERLES) 100 MG capsule; Take 2 capsules (200 mg total) by mouth 3 (three) times daily as needed for cough.  Dispense: 30 capsule; Refill: 0   No results found for any visits on 07/08/22.  Follow up plan: Return if symptoms worsen or fail to improve.  Deliah Boston, MSN, APRN, FNP-C  Subjective:  HPI: Jenna Lane is a 32 y.o. female presenting on 07/08/2022 for Cough (Slight congestion - mainly dry now. This cough started about 7-10 days ago)  Patient complains of cough and head/chest congestion. Cough is worse first thing in the morning and late at night. She denies fever, shortness of breath, and wheezing. Onset of symptoms was  7-10  days ago. She is drinking plenty of fluids. Evaluation to date: none. Treatment to date:  Dayquil . She does not smoke.    ROS: Negative unless specifically indicated above in HPI.   Relevant past medical history reviewed and updated as indicated.   Allergies and medications reviewed and updated.   Current Outpatient Medications:    ALPRAZolam (XANAX) 0.5 MG tablet, Take 0.25-0.5 mg 30-60 minutes prior to dental appointment (Patient not taking: Reported on 03/04/2022), Disp: 10 tablet, Rfl: 0   Multiple Vitamin (MULTIVITAMIN) capsule, Take 1 capsule by mouth daily., Disp: , Rfl:    NUVARING 0.12-0.015 MG/24HR vaginal ring, Place 1 each vaginally every 30 (thirty) days., Disp: , Rfl:    sertraline (ZOLOFT) 50 MG tablet, Take 1 tablet by mouth once daily, Disp: 30 tablet, Rfl: 3  Allergies  Allergen Reactions   [name] [Drospirenone-Ethinyl Estradiol] Rash    Objective:   BP 94/78   Pulse 66   Temp 98.1 F (36.7 C)   Resp 20   Ht 5\' 3"  (1.6 m)   Wt 186 lb (84.4 kg)   LMP 06/26/2022 (Exact Date)   Breastfeeding No   BMI  32.95 kg/m    Physical Exam Vitals reviewed.  Constitutional:      General: She is not in acute distress.    Appearance: Normal appearance. She is not ill-appearing, toxic-appearing or diaphoretic.  HENT:     Head: Normocephalic and atraumatic.     Right Ear: Tympanic membrane, ear canal and external ear normal. There is no impacted cerumen.     Left Ear: Tympanic membrane, ear canal and external ear normal. There is no impacted cerumen.     Nose: Nose normal. No congestion or rhinorrhea.     Right Sinus: No maxillary sinus tenderness or frontal sinus tenderness.     Left Sinus: No maxillary sinus tenderness or frontal sinus tenderness.     Mouth/Throat:     Mouth: Mucous membranes are moist.     Pharynx: Oropharynx is clear. No oropharyngeal exudate or posterior oropharyngeal erythema.  Eyes:     General: No scleral icterus.       Right eye: No discharge.        Left eye: No discharge.     Conjunctiva/sclera: Conjunctivae normal.  Cardiovascular:     Rate and Rhythm: Normal rate and regular rhythm.     Heart sounds: Normal heart sounds. No murmur heard.    No friction rub. No gallop.  Pulmonary:     Effort: Pulmonary effort is normal. No respiratory distress.     Breath sounds:  Normal breath sounds. No stridor. No wheezing, rhonchi or rales.  Musculoskeletal:        General: Normal range of motion.     Cervical back: Normal range of motion.  Lymphadenopathy:     Cervical: No cervical adenopathy.  Skin:    General: Skin is warm and dry.     Capillary Refill: Capillary refill takes less than 2 seconds.  Neurological:     General: No focal deficit present.     Mental Status: She is alert and oriented to person, place, and time. Mental status is at baseline.  Psychiatric:        Mood and Affect: Mood normal.        Behavior: Behavior normal.        Thought Content: Thought content normal.        Judgment: Judgment normal.

## 2022-07-22 ENCOUNTER — Other Ambulatory Visit: Payer: Self-pay | Admitting: Internal Medicine

## 2022-09-02 ENCOUNTER — Other Ambulatory Visit: Payer: Self-pay | Admitting: Internal Medicine

## 2022-09-13 ENCOUNTER — Ambulatory Visit: Payer: BC Managed Care – PPO | Admitting: Sports Medicine

## 2022-09-13 VITALS — BP 110/84 | HR 91 | Ht 63.0 in | Wt 178.0 lb

## 2022-09-13 DIAGNOSIS — M9904 Segmental and somatic dysfunction of sacral region: Secondary | ICD-10-CM | POA: Diagnosis not present

## 2022-09-13 DIAGNOSIS — M9905 Segmental and somatic dysfunction of pelvic region: Secondary | ICD-10-CM | POA: Diagnosis not present

## 2022-09-13 DIAGNOSIS — M545 Low back pain, unspecified: Secondary | ICD-10-CM

## 2022-09-13 DIAGNOSIS — M9903 Segmental and somatic dysfunction of lumbar region: Secondary | ICD-10-CM | POA: Diagnosis not present

## 2022-09-13 MED ORDER — MELOXICAM 15 MG PO TABS
15.0000 mg | ORAL_TABLET | Freq: Every day | ORAL | 0 refills | Status: AC
Start: 2022-09-13 — End: ?

## 2022-09-13 NOTE — Addendum Note (Signed)
Addended by: Richardean Sale on: 09/13/2022 12:26 PM   Modules accepted: Orders

## 2022-09-13 NOTE — Progress Notes (Signed)
    A[facility] [address] [place] [address] Phone: [phone]   Assessment and Plan:     1. Acute bilateral low back pain without sciatica 2. Somatic dysfunction of lumbar region 3. Somatic dysfunction of pelvic region 4. Somatic dysfunction of sacral region  -Acute, uncomplicated, initial sports medicine visit - Most consistent with acute lower lumbar muscular strain versus SI joint flare likely occurring due to assisting with patient transfer 3 weeks ago - No red flags on physical exam, so no imaging at today's visit - Start meloxicam 15 mg daily x2 weeks.  If still having pain after 2 weeks, complete 3rd-week of meloxicam. May use remaining meloxicam as needed once daily for pain control.  Do not to use additional NSAIDs while taking meloxicam.  May use Tylenol [phone] mg 2 to 3 times a day for breakthrough pain. - Patient elected for initial OMT today.  Tolerated well per note below. - Decision today to treat with OMT was based on Physical Exam  After verbal consent patient was treated with HVLA (high velocity low amplitude), ME (muscle energy), FPR (flex positional release), ST (soft tissue), PC/PD (Pelvic Compression/ Pelvic Decompression) techniques in sacrum, lumbar, and pelvic areas. Patient tolerated the procedure well with improvement in symptoms.  Patient educated on potential side effects of soreness and recommended to rest, hydrate, and use Tylenol as needed for pain control.   Pertinent previous records reviewed include none   Follow Up: As needed if no improvement or worsening of symptoms.  Could consider lumbar x-ray versus physical therapy versus repeat OMT   Subjective:   I, Debbe Odea, am serving as a scribe for Dr. Richardean Sale  Chief Complaint: Low back pain  HPI:   09/13/22 Patient is a 32 year old female presenting with low back pain going on for 3 weeks patient states that this started after  lifting a heavy object. Tried NSAID's tylenol.  Relevant Historical Information: None pertinent  Additional pertinent review of systems negative.   Current Outpatient Medications:    ALPRAZolam (XANAX) 0.5 MG tablet, Take 0.25-0.5 mg 30-60 minutes prior to dental appointment (Patient not taking: Reported on 03/04/2022), Disp: 10 tablet, Rfl: 0   benzonatate (TESSALON PERLES) 100 MG capsule, Take 2 capsules (200 mg total) by mouth 3 (three) times daily as needed for cough., Disp: 30 capsule, Rfl: 0   Multiple Vitamin (MULTIVITAMIN) capsule, Take 1 capsule by mouth daily., Disp: , Rfl:    NUVARING 0.12-0.015 MG/24HR vaginal ring, Place 1 each vaginally every 30 (thirty) days., Disp: , Rfl:    sertraline (ZOLOFT) 50 MG tablet, Take 1 tablet by mouth once daily, Disp: 30 tablet, Rfl: 5   Objective:     Vitals:   09/13/22 1157  BP: 110/84  Pulse: 91  SpO2: 97%  Weight: 178 lb (80.7 kg)  Height: 5\' 3"  (1.6 m)      Body mass index is 31.53 kg/m.    Physical Exam:       General: Well-appearing, cooperative, sitting comfortably in no acute distress.   OMT Physical Exam:  ASIS Compression Test: Positive Right Sacrum: Positive sphinx, TTP bilateral sacral base Lumbar: TTP paraspinal, limited rotation bilaterally.  L1-3 RRSL Pelvis: Right anterior innominate    Electronically signed by:  Aleen [facility] 12:19 PM 09/13/22

## 2022-12-20 ENCOUNTER — Encounter (INDEPENDENT_AMBULATORY_CARE_PROVIDER_SITE_OTHER): Payer: BC Managed Care – PPO | Admitting: Internal Medicine

## 2022-12-20 DIAGNOSIS — E66812 Obesity, class 2: Secondary | ICD-10-CM

## 2022-12-24 DIAGNOSIS — Z6835 Body mass index (BMI) 35.0-35.9, adult: Secondary | ICD-10-CM

## 2022-12-24 DIAGNOSIS — E66812 Obesity, class 2: Secondary | ICD-10-CM

## 2022-12-25 MED ORDER — PHENTERMINE HCL 37.5 MG PO CAPS: 37.5000 mg | ORAL_CAPSULE | ORAL | 0 refills | Status: DC

## 2022-12-25 NOTE — Telephone Encounter (Signed)

## 2022-12-25 NOTE — Addendum Note (Signed)
Addended by: Pincus Sanes on: 12/25/2022 01:07 PM   Modules accepted: Orders

## 2023-03-25 ENCOUNTER — Encounter: Payer: Self-pay | Admitting: Internal Medicine

## 2023-03-25 ENCOUNTER — Ambulatory Visit: Admitting: Family Medicine

## 2023-03-25 ENCOUNTER — Other Ambulatory Visit: Payer: Self-pay

## 2023-03-25 VITALS — Ht 63.0 in

## 2023-03-25 DIAGNOSIS — M25512 Pain in left shoulder: Secondary | ICD-10-CM

## 2023-03-25 DIAGNOSIS — M7552 Bursitis of left shoulder: Secondary | ICD-10-CM | POA: Diagnosis not present

## 2023-03-25 DIAGNOSIS — M25531 Pain in right wrist: Secondary | ICD-10-CM

## 2023-03-25 NOTE — Patient Instructions (Addendum)
 Injections in shoulder today See me when you need me

## 2023-03-25 NOTE — Assessment & Plan Note (Signed)
 Patient given injection and tolerated the procedure well, discussed icing regimen proper lifting mechanics.  Will monitor.  Worsening pain consider x-ray but do not think it would be necessary.  Follow-up again in 8 to 12 weeks if needed

## 2023-03-25 NOTE — Progress Notes (Signed)
 [facility] [address] [place] [address] Phone: [phone] Subjective:   INadine Counts, am serving as a scribe for Dr. Antoine Primas.  I'm seeing this patient by the request  of:  Pincus Sanes, MD  CC: Left shoulder pain  [id number]  Jenna Lane is a 33 y.o. female coming in with complaint of L shoulder pain. Abduction is bothering her. R wrist pain has been in discomfort for 6 months and getting worse.       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 OR;  Service: ENT;  Laterality: N/A;   WISDOM TOOTH EXTRACTION     Social History   Socioeconomic History   Marital status: Married    Spouse name: Yamileth Hayse   Number of children: 0   Years of education: Associates   Highest education level: Associate degree: occupational, Scientist, product/process development, or vocational program  Occupational History   Occupation: Art therapist- Engineer, structural  Tobacco Use   Smoking status: Never   Smokeless tobacco: Never  Vaping Use   Vaping status: Never Used  Substance and Sexual Activity   Alcohol use: Not Currently    Alcohol/week: 2.0 standard drinks of  alcohol    Types: 2 Standard drinks or equivalent per week   Drug use: No   Sexual activity: Yes    Partners: Male    Birth control/protection: Inserts    Comment: nuvaring  Other Topics Concern   Not on file  Social History Narrative   Lives alone   Caffeine use: 1 cup daily   Right-handed   Social Drivers of Health   Financial Resource Strain: Low Risk  (03/25/2023)   Overall Financial Resource Strain (CARDIA)    Difficulty of Paying Living Expenses: Not hard at all  Food Insecurity: No Food Insecurity (03/25/2023)   Hunger Vital Sign    Worried About Running Out of Food in the Last Year: Never true    Ran Out of Food in the Last Year: Never true  Transportation Needs: No Transportation Needs (03/25/2023)   PRAPARE - Administrator, Civil Service (Medical): No    Lack of Transportation (Non-Medical): No  Physical Activity: Insufficiently Active (03/25/2023)   Exercise Vital Sign    Days of Exercise per Week: 3 days    Minutes of Exercise per Session: 30 min  Stress: No Stress Concern Present (03/25/2023)   [employer] of Occupational Health - Occupational Stress Questionnaire    Feeling of Stress : Only a little  Social Connections: Unknown (03/25/2023)   Social Connection and Isolation Panel [NHANES]    Frequency of Communication with Friends and Family: More than three times a week    Frequency of Social Gatherings with Friends and Family: Once a week    Attends Religious Services: Patient declined    Database administrator or Organizations: Yes    Attends Engineer, structural: More than 4 times per year    Marital Status: Married   Allergies  Allergen Reactions   [name] [Drospirenone-Ethinyl Estradiol] Rash   Family History  Problem Relation Age of Onset   Hyperlipidemia Father    Hypertension Father        ?   Depression Maternal Uncle    Lung cancer Maternal Grandfather    Heart disease Paternal Grandmother    Heart disease Paternal  Grandfather     Current Outpatient Medications (Endocrine & Metabolic):    NUVARING 0.12-0.015 MG/24HR vaginal ring, Place 1 each vaginally every 30 (thirty) days.   Current Outpatient Medications (Respiratory):    benzonatate (TESSALON PERLES) 100 MG capsule, Take 2 capsules (200 mg total) by mouth 3 (three) times daily as needed for cough.  Current Outpatient Medications (Analgesics):    meloxicam (MOBIC) 15 MG tablet, Take 1 tablet (15 mg total) by mouth daily.   Current Outpatient Medications (Other):    ALPRAZolam (XANAX) 0.5 MG tablet, Take 0.25-0.5 mg 30-60 minutes prior to dental appointment (Patient not taking: Reported on 03/04/2022)   Multiple Vitamin (MULTIVITAMIN) capsule, Take 1 capsule by mouth daily.   phentermine 37.5 MG capsule, Take 1 capsule (37.5 mg total) by mouth every morning.   sertraline (ZOLOFT) 50 MG tablet, Take 1 tablet by mouth once daily   Reviewed prior external information including notes and imaging from  primary care provider As well as notes that were available from care everywhere and other healthcare systems.  Past medical history, social, surgical and family history all reviewed in electronic medical record.  No pertanent information unless stated regarding to the chief complaint.   Review of Systems:  No headache, visual changes, nausea, vomiting, diarrhea, constipation, dizziness, abdominal pain, skin rash, fevers, chills, night sweats, weight loss, swollen lymph nodes, body aches, joint swelling, chest pain, shortness of breath, mood changes. POSITIVE muscle aches  Objective  Height 5\' 3"  (1.6 m).   General: No apparent distress alert and oriented x3 mood and affect normal, dressed appropriately.  HEENT: Pupils equal, extraocular movements intact  Respiratory: Patient's speak in full sentences and does not appear short of breath  Cardiovascular: No lower extremity edema, non tender, no erythema  Left shoulder exam shows that there is  positive impingement noted.  Rotator cuff strength is 5 out of 5 and symmetric to the contralateral side.  I does have tenderness with crossover test noted.  Procedure: Real-time Ultrasound Guided Injection of left glenohumeral joint Device: GE Logiq E  Ultrasound guided injection is preferred based studies that show increased duration, increased effect, greater accuracy, decreased procedural pain, increased response rate with ultrasound guided versus blind injection.  Verbal informed consent obtained.  Time-out conducted.  Noted no overlying erythema, induration, or other signs of local infection.  Skin prepped in a sterile fashion.  Local anesthesia: Topical Ethyl chloride.  With sterile technique and under real time ultrasound guidance:  Joint visualized.  21g 2 inch needle inserted posterior approach. Pictures taken for needle placement. Patient did have injection of 2 cc of 0.5% Marcaine, and 1cc of Kenalog 40  mg/dL. Completed without difficulty  Pain immediately resolved suggesting accurate placement of the medication.  Advised to call if fevers/chills, erythema, induration, drainage, or persistent bleeding.  Images permanently stored Impression: Technically successful ultrasound guided injection.  Procedure: Real-time Ultrasound Guided Injection of left acromioclavicular joint Device: GE Logiq Q7 Ultrasound guided injection is preferred based studies that show increased duration, increased effect, greater accuracy, decreased procedural pain, increased response rate, and decreased cost with ultrasound guided versus blind injection.  Verbal informed consent obtained.  Time-out conducted.  Noted no overlying erythema, induration, or other signs of local infection.  Skin prepped in a sterile fashion.  Local anesthesia: Topical Ethyl chloride.  With sterile technique and under real time ultrasound guidance: With a 25-gauge half inch needle injected with 0.5 cc of 0.5% Marcaine and 0.5 cc  of Kenalog 40 mg/mL Completed without difficulty  Pain immediately resolved suggesting accurate placement of the medication.  Advised to call if fevers/chills, erythema, induration, drainage, or persistent bleeding.  Impression: Technically successful ultrasound guided injection.    Impression and Recommendations:     The above documentation has been reviewed and is accurate and complete Judi Saa, DO

## 2023-03-25 NOTE — Patient Instructions (Addendum)

## 2023-03-25 NOTE — Assessment & Plan Note (Signed)
 Chronic problem with exacerbation, has been quite sometime since we have done anything.  Discussed icing regimen of home exercises, discussed definitive lifting procedures.  Will consider home exercises if it can continues to give her trouble.  Did have more of a subacromial bursitis this time as well and given injection into the subacromial area we will monitor.  Follow-up again in 2 to 3 months

## 2023-03-25 NOTE — Progress Notes (Unsigned)
 Subjective:    Patient ID: Jenna Lane, female    DOB: 03/09/1990, 33 y.o.   MRN: [id number]      HPI Tenleigh is here for a Physical exam and her chronic medical problems.    No new health issues.  No concerns.   Medications and allergies reviewed with patient and updated if appropriate.  Current Outpatient Medications on File Prior to Visit  Medication Sig Dispense Refill   ALPRAZolam (XANAX) 0.5 MG tablet Take 0.25-0.5 mg 30-60 minutes prior to dental appointment 10 tablet 0   meloxicam (MOBIC) 15 MG tablet Take 1 tablet (15 mg total) by mouth daily. 30 tablet 0   Multiple Vitamin (MULTIVITAMIN) capsule Take 1 capsule by mouth daily.     NUVARING 0.12-0.015 MG/24HR vaginal ring Place 1 each vaginally every 30 (thirty) days.     phentermine 37.5 MG capsule Take 1 capsule (37.5 mg total) by mouth every morning. 30 capsule 0   sertraline (ZOLOFT) 50 MG tablet Take 1 tablet by mouth once daily 30 tablet 5   No current facility-administered medications on file prior to visit.    Review of Systems  Constitutional:  Negative for fever.  Eyes:  Negative for visual disturbance.  Respiratory:  Negative for cough, shortness of breath and wheezing.   Cardiovascular:  Negative for chest pain, palpitations and leg swelling.  Gastrointestinal:  Positive for diarrhea (chronic). Negative for abdominal pain, blood in stool and constipation.       No gerd  Genitourinary:  Negative for dysuria.  Musculoskeletal:  Negative for arthralgias and back pain.  Skin:  Negative for rash.  Neurological:  Negative for light-headedness and headaches.  Psychiatric/Behavioral:  Positive for dysphoric mood (controlled). The patient is nervous/anxious (controlled).        Objective:   Vitals:   03/26/23 0750  BP: 106/80  Pulse: (!) 53  Temp: 98.1 F (36.7 C)  SpO2: 97%   Filed Weights   03/26/23 0750  Weight: 184 lb (83.5 kg)   Body mass index is 32.59 kg/m.  BP Readings from Last 3  Encounters:  03/26/23 106/80  09/13/22 110/84  07/08/22 94/78    Wt Readings from Last 3 Encounters:  03/26/23 184 lb (83.5 kg)  09/13/22 178 lb (80.7 kg)  07/08/22 186 lb (84.4 kg)       Physical Exam Constitutional: She appears well-developed and well-nourished. No distress.  HENT:  Head: Normocephalic and atraumatic.  Right Ear: External ear normal. Normal ear canal and TM Left Ear: External ear normal.  Normal ear canal and TM Mouth/Throat: Oropharynx is clear and moist.  Eyes: Conjunctivae normal.  Neck: Neck supple. No tracheal deviation present. No thyromegaly present.  No carotid bruit  Cardiovascular: Normal rate, regular rhythm and normal heart sounds.   No murmur heard.  No edema. Pulmonary/Chest: Effort normal and breath sounds normal. No respiratory distress. She has no wheezes. She has no rales.  Breast: deferred   Abdominal: Soft. She exhibits no distension. There is no tenderness.  Lymphadenopathy: She has no cervical adenopathy.  Skin: Skin is warm and dry. She is not diaphoretic.  Psychiatric: She has a normal mood and affect. Her behavior is normal.     Lab Results  Component Value Date   WBC 7.6 03/05/2022   HGB 14.0 03/05/2022   HCT 40.4 03/05/2022   PLT 249.0 03/05/2022   GLUCOSE 81 03/05/2022   CHOL 135 03/05/2022   TRIG 114.0 03/05/2022   HDL 50.10 03/05/2022  LDLCALC 62 03/05/2022   ALT 22 03/05/2022   AST 18 03/05/2022   NA 139 03/05/2022   K 4.3 03/05/2022   CL 107 03/05/2022   CREATININE 0.79 03/05/2022   BUN 12 03/05/2022   CO2 24 03/05/2022   TSH 3.04 03/05/2022   HGBA1C 4.8 01/20/2020         Assessment & Plan:   Physical exam: Screening blood work  ordered Exercise  at least 3 times a week Weight  obese - working on weight loss Substance abuse  none   Reviewed recommended immunizations.   Health Maintenance  Topic Date Due   Cervical Cancer Screening (HPV/Pap Cotest)  02/21/2023   COVID-19 Vaccine (4 -  2024-25 season) 04/10/2023 (Originally 09/15/2022)   DTaP/Tdap/Td (4 - Td or Tdap) 08/30/2030   INFLUENZA VACCINE  Completed   Hepatitis C Screening  Completed   HIV Screening  Completed   HPV VACCINES  Aged Out          See Problem List for Assessment and Plan of chronic medical problems.

## 2023-03-26 ENCOUNTER — Ambulatory Visit (INDEPENDENT_AMBULATORY_CARE_PROVIDER_SITE_OTHER): Payer: BC Managed Care – PPO | Admitting: Internal Medicine

## 2023-03-26 VITALS — BP 106/80 | HR 53 | Temp 98.1°F | Ht 63.0 in | Wt 184.0 lb

## 2023-03-26 DIAGNOSIS — E66812 Obesity, class 2: Secondary | ICD-10-CM | POA: Diagnosis not present

## 2023-03-26 DIAGNOSIS — F419 Anxiety disorder, unspecified: Secondary | ICD-10-CM

## 2023-03-26 DIAGNOSIS — Z6835 Body mass index (BMI) 35.0-35.9, adult: Secondary | ICD-10-CM

## 2023-03-26 DIAGNOSIS — Z0000 Encounter for general adult medical examination without abnormal findings: Secondary | ICD-10-CM

## 2023-03-26 DIAGNOSIS — F32A Depression, unspecified: Secondary | ICD-10-CM

## 2023-03-26 DIAGNOSIS — E559 Vitamin D deficiency, unspecified: Secondary | ICD-10-CM

## 2023-03-26 LAB — COMPREHENSIVE METABOLIC PANEL
ALT: 23 U/L (ref 0–35)
AST: 19 U/L (ref 0–37)
Albumin: 4.6 g/dL (ref 3.5–5.2)
Alkaline Phosphatase: 50 U/L (ref 39–117)
BUN: 13 mg/dL (ref 6–23)
CO2: 23 meq/L (ref 19–32)
Calcium: 9.4 mg/dL (ref 8.4–10.5)
Chloride: 107 meq/L (ref 96–112)
Creatinine, Ser: 0.64 mg/dL (ref 0.40–1.20)
GFR: 116.17 mL/min (ref >60.00)
Glucose, Bld: 88 mg/dL (ref 70–99)
Potassium: 4.3 meq/L (ref 3.5–5.1)
Sodium: 139 meq/L (ref 135–145)
Total Bilirubin: 0.9 mg/dL (ref 0.2–1.2)
Total Protein: 6.9 g/dL (ref 6.0–8.3)

## 2023-03-26 LAB — VITAMIN D 25 HYDROXY (VIT D DEFICIENCY, FRACTURES): VITD: 26.26 ng/mL — ABNORMAL LOW (ref 30.00–100.00)

## 2023-03-26 LAB — LIPID PANEL
Cholesterol: 167 mg/dL (ref 0–200)
HDL: 59 mg/dL (ref >39.00)
LDL Cholesterol: 96 mg/dL (ref 0–99)
NonHDL: 108.48
Total CHOL/HDL Ratio: 3
Triglycerides: 61 mg/dL (ref 0.0–149.0)
VLDL: 12.2 mg/dL (ref 0.0–40.0)

## 2023-03-26 LAB — CBC WITH DIFFERENTIAL/PLATELET
Basophils Absolute: 0 10*3/uL (ref 0.0–0.1)
Basophils Relative: 0.6 % (ref 0.0–3.0)
Eosinophils Absolute: 0.1 10*3/uL (ref 0.0–0.7)
Eosinophils Relative: 1 % (ref 0.0–5.0)
HCT: 41.1 % (ref 36.0–46.0)
Hemoglobin: 14.1 g/dL (ref 12.0–15.0)
Lymphocytes Relative: 31.3 % (ref 12.0–46.0)
Lymphs Abs: 2.1 10*3/uL (ref 0.7–4.0)
MCHC: 34.4 g/dL (ref 30.0–36.0)
MCV: 88 fl (ref 78.0–100.0)
Monocytes Absolute: 0.3 10*3/uL (ref 0.1–1.0)
Monocytes Relative: 4.8 % (ref 3.0–12.0)
Neutro Abs: 4.1 10*3/uL (ref 1.4–7.7)
Neutrophils Relative %: 62.3 % (ref 43.0–77.0)
Platelets: 243 10*3/uL (ref 150.0–400.0)
RBC: 4.67 Mil/uL (ref 3.87–5.11)
RDW: 12.9 % (ref 11.5–15.5)
WBC: 6.6 10*3/uL (ref 4.0–10.5)

## 2023-03-26 LAB — TSH: TSH: 1.88 u[IU]/mL (ref 0.35–5.50)

## 2023-03-26 MED ORDER — SERTRALINE HCL 50 MG PO TABS: 50.0000 mg | ORAL_TABLET | Freq: Every day | ORAL | 3 refills | Status: AC

## 2023-03-26 NOTE — Assessment & Plan Note (Signed)
Chronic Controlled, stable Continue sertraline 50 mg daily  

## 2023-03-26 NOTE — Assessment & Plan Note (Signed)
Chronic Check vitamin D level 

## 2023-03-26 NOTE — Assessment & Plan Note (Addendum)
 Chronic Working on weight loss Continue regular exercise, healthy diet Never took phentermine

## 2023-03-27 ENCOUNTER — Encounter: Payer: Self-pay | Admitting: Internal Medicine

## 2023-04-23 DIAGNOSIS — Z1151 Encounter for screening for human papillomavirus (HPV): Secondary | ICD-10-CM | POA: Diagnosis not present

## 2023-04-23 DIAGNOSIS — Z01419 Encounter for gynecological examination (general) (routine) without abnormal findings: Secondary | ICD-10-CM | POA: Diagnosis not present

## 2023-04-23 DIAGNOSIS — Z124 Encounter for screening for malignant neoplasm of cervix: Secondary | ICD-10-CM | POA: Diagnosis not present

## 2023-04-23 DIAGNOSIS — Z6832 Body mass index (BMI) 32.0-32.9, adult: Secondary | ICD-10-CM | POA: Diagnosis not present

## 2023-04-28 LAB — HM PAP SMEAR: HPV, high-risk: NEGATIVE

## 2023-05-03 DIAGNOSIS — F1090 Alcohol use, unspecified, uncomplicated: Secondary | ICD-10-CM | POA: Diagnosis not present

## 2023-05-03 DIAGNOSIS — R55 Syncope and collapse: Secondary | ICD-10-CM | POA: Diagnosis not present

## 2023-05-03 DIAGNOSIS — I959 Hypotension, unspecified: Secondary | ICD-10-CM | POA: Diagnosis not present

## 2023-05-03 DIAGNOSIS — R001 Bradycardia, unspecified: Secondary | ICD-10-CM | POA: Diagnosis not present

## 2023-05-04 DIAGNOSIS — F10929 Alcohol use, unspecified with intoxication, unspecified: Secondary | ICD-10-CM | POA: Diagnosis not present

## 2023-05-04 DIAGNOSIS — Y907 Blood alcohol level of 200-239 mg/100 ml: Secondary | ICD-10-CM | POA: Diagnosis not present

## 2023-05-04 DIAGNOSIS — F1090 Alcohol use, unspecified, uncomplicated: Secondary | ICD-10-CM | POA: Diagnosis not present

## 2023-05-04 DIAGNOSIS — R55 Syncope and collapse: Secondary | ICD-10-CM | POA: Diagnosis not present

## 2023-05-04 DIAGNOSIS — F10129 Alcohol abuse with intoxication, unspecified: Secondary | ICD-10-CM | POA: Diagnosis not present

## 2023-05-05 DIAGNOSIS — Z136 Encounter for screening for cardiovascular disorders: Secondary | ICD-10-CM | POA: Diagnosis not present

## 2023-06-16 NOTE — Progress Notes (Signed)
 [facility] [address] [place] [address] Phone: [phone] Subjective:   IBryan Caprio, am serving as a scribe for Dr. Ronnell Coins.  I'm seeing this patient by the request  of:  Colene Dauphin, MD  CC: shoulder pain   [id number]  03/25/2023 Patient given injection and tolerated the procedure well, discussed icing regimen proper lifting mechanics.  Will monitor.  Worsening pain consider x-ray but do not think it would be necessary.  Follow-up again in 8 to 12 weeks if needed     Chronic problem with exacerbation, has been quite sometime since we have done anything.  Discussed icing regimen of home exercises, discussed definitive lifting procedures.  Will consider home exercises if it can continues to give her trouble.  Did have more of a subacromial bursitis this time as well and given injection into the subacromial area we will monitor.  Follow-up again in 2 to 3 months     Update 06/17/2023 Jenna Lane is a 33 y.o. female coming in with complaint of L shoulder pain.  Patient was given an injection in March.  We did the shoulder as well as the [facility] joint.  Patient states last injection didn't help. Overhead motions hurt the most.      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   IBS (irritable bowel syndrome)    MRSA infection 2009   on skin   Right ankle injury    Vitamin D  deficiency    Past Surgical History:  Procedure Laterality Date   CESAREAN SECTION N/A 11/15/2020   Procedure: PRIMARY CESAREAN SECTION EDC: 11-21-20 ALLERG: [name] ;  Surgeon: Concepcion Deck, MD;  Location: [facility];  Service: Obstetrics;  Laterality: N/A;   COLPOSCOPY  2011   skin cyst excision      left axillary   TONSILLECTOMY AND ADENOIDECTOMY N/A 01/02/2013   Procedure: TONSILLECTOMY ;  Surgeon: Lenton Rail, MD;  [facility];  Service: ENT;  Laterality: N/A;   WISDOM TOOTH EXTRACTION     Social History   Socioeconomic History   Marital status: Married    Spouse name: Eletha Culbertson   Number of children: 0   Years of education: Associates   Highest education level: Associate degree: occupational, Scientist, product/process development, or vocational program  Occupational History   Occupation: Art therapist- Engineer, structural  Tobacco Use   Smoking status: Never   Smokeless tobacco: Never  Vaping Use   Vaping status: Never Used  Substance and Sexual Activity   Alcohol use: Not Currently    Alcohol/week: 2.0 standard drinks of alcohol    Types: 2 Standard drinks or equivalent per week   Drug use: No   Sexual activity: Yes    Partners: Male    Birth control/protection: Inserts    Comment: nuvaring  Other Topics Concern   Not on file  Social History Narrative   Lives alone   Caffeine use: 1 cup daily   Right-handed   Social Drivers of Health   Financial Resource Strain: Low Risk  (03/25/2023)   Overall Financial Resource Strain (CARDIA)    Difficulty of Paying Living Expenses: Not  hard at all  Food Insecurity: No Food Insecurity (03/25/2023)   Hunger Vital Sign    Worried About Running Out of Food in the Last Year: Never true    Ran Out of Food in the Last Year: Never true  Transportation Needs: No Transportation Needs (03/25/2023)   PRAPARE - Administrator, Civil Service (Medical): No    Lack of Transportation (Non-Medical): No  Physical Activity: Insufficiently Active (03/25/2023)   Exercise Vital Sign    Days of Exercise per Week: 3 days    Minutes of Exercise per Session: 30 min  Stress: No Stress Concern Present (03/25/2023)   [employer] of Occupational Health - Occupational Stress Questionnaire    Feeling of Stress : Only a little  Social Connections: Unknown  (03/25/2023)   Social Connection and Isolation Panel [NHANES]    Frequency of Communication with Friends and Family: More than three times a week    Frequency of Social Gatherings with Friends and Family: Once a week    Attends Religious Services: Patient declined    Database administrator or Organizations: Yes    Attends Engineer, structural: More than 4 times per year    Marital Status: Married   Allergies  Allergen Reactions   [name]  [Drospirenone -Ethinyl Estradiol ] Rash   Family History  Problem Relation Age of Onset   Hyperlipidemia Father    Hypertension Father        ?   Depression Maternal Uncle    Lung cancer Maternal Grandfather    Heart disease Paternal Grandmother    Heart disease Paternal Grandfather     Current Outpatient Medications (Endocrine & Metabolic):    NUVARING 0.12-0.015 MG/24HR vaginal ring, Place 1 each vaginally every 30 (thirty) days.    Current Outpatient Medications (Analgesics):    meloxicam  (MOBIC ) 15 MG tablet, Take 1 tablet (15 mg total) by mouth daily.   Current Outpatient Medications (Other):    ALPRAZolam  (XANAX ) 0.5 MG tablet, Take 0.25-0.5 mg 30-60 minutes prior to dental appointment   Multiple Vitamin (MULTIVITAMIN) capsule, Take 1 capsule by mouth daily.   phentermine  37.5 MG capsule, Take 1 capsule (37.5 mg total) by mouth every morning.   sertraline  (ZOLOFT ) 50 MG tablet, Take 1 tablet (50 mg total) by mouth daily.   Reviewed prior external information including notes and imaging from  primary care provider As well as notes that were available from care everywhere and other healthcare systems.  Past medical history, social, surgical and family history all reviewed in electronic medical record.  No pertanent information unless stated regarding to the chief complaint.   Review of Systems:  No headache, visual changes, nausea, vomiting, diarrhea, constipation, dizziness, abdominal pain, skin rash, fevers, chills, night  sweats, weight loss, swollen lymph nodes, body aches, joint swelling, chest pain, shortness of breath, mood changes. POSITIVE muscle aches  Objective  Height 5\' 3"  (1.6 m).   General: No apparent distress alert and oriented x3 mood and affect normal, dressed appropriately.  HEENT: Pupils equal, extraocular movements intact  Respiratory: Patient's speak in full sentences and does not appear short of breath  Cardiovascular: No lower extremity edema, non tender, no erythema  Left shoulder exam shows positive impingement noted.  Some positive O'Brien's that is fairly significant this time.  Seems to be more than past exam.  Affecting range of motion as well.   Limited muscular skeletal ultrasound was performed and interpreted by Ronnell Coins, M  Limited ultrasound shows hypoechoic changes  consistent with a subacromial bursitis again.  May be less than the last time but unfortunately can see some chronic changes noted of the supraspinatus.  No true acute tear appreciated though.  No significant retraction that we can see on ultrasound.  Patient does have posterior labral calcific changes consistent with a potential chronic tear.   Impression and Recommendations:    The above documentation has been reviewed and is accurate and complete Mychaela Lennartz M Jivan Symanski, DO

## 2023-06-17 ENCOUNTER — Ambulatory Visit: Admitting: Family Medicine

## 2023-06-17 ENCOUNTER — Other Ambulatory Visit: Payer: Self-pay

## 2023-06-17 ENCOUNTER — Encounter: Payer: Self-pay | Admitting: Family Medicine

## 2023-06-17 ENCOUNTER — Ambulatory Visit (INDEPENDENT_AMBULATORY_CARE_PROVIDER_SITE_OTHER)

## 2023-06-17 VITALS — Ht 63.0 in

## 2023-06-17 DIAGNOSIS — M7552 Bursitis of left shoulder: Secondary | ICD-10-CM

## 2023-06-17 DIAGNOSIS — M25512 Pain in left shoulder: Secondary | ICD-10-CM | POA: Diagnosis not present

## 2023-06-17 NOTE — Assessment & Plan Note (Addendum)
 Unfortunately patient is under the conservative therapy.  At this point I do feel that advanced imaging is warranted.  Patient has failed multiple injections, home exercises, affecting daily activities, waking patient up at night.  Discussed icing regimen and home exercises.  Increase activity slowly.  Do feel with the changes noted of the posterior labrum I do feel that advanced imaging is also warranted.  Will get MRI of the shoulder with arthrogram.  Going to evaluate the labrum.

## 2023-06-17 NOTE — Patient Instructions (Addendum)
 MR Arthrogram L shoulder Xray today Can take meloxicam  if needed

## 2023-06-23 ENCOUNTER — Ambulatory Visit: Payer: Self-pay | Admitting: Family Medicine

## 2023-07-08 ENCOUNTER — Ambulatory Visit
Admission: RE | Admit: 2023-07-08 | Discharge: 2023-07-08 | Disposition: A | Discharge status: Home / Self Care | Source: Ambulatory Visit | Attending: Family Medicine | Admitting: Family Medicine

## 2023-07-08 DIAGNOSIS — M25512 Pain in left shoulder: Secondary | ICD-10-CM | POA: Diagnosis not present

## 2023-07-08 DIAGNOSIS — M7552 Bursitis of left shoulder: Secondary | ICD-10-CM

## 2023-07-08 MED ORDER — IOPAMIDOL (ISOVUE-M 200) INJECTION 41%
10.0000 mL | Freq: Once | INTRAMUSCULAR | Status: AC
  Administered 2023-07-08: 10 mL via INTRA_ARTICULAR

## 2023-07-23 ENCOUNTER — Ambulatory Visit: Admitting: Family Medicine

## 2023-07-23 ENCOUNTER — Encounter: Payer: Self-pay | Admitting: Family Medicine

## 2023-07-23 VITALS — Ht 63.0 in

## 2023-07-23 DIAGNOSIS — M9908 Segmental and somatic dysfunction of rib cage: Secondary | ICD-10-CM | POA: Diagnosis not present

## 2023-07-23 DIAGNOSIS — M542 Cervicalgia: Secondary | ICD-10-CM | POA: Diagnosis not present

## 2023-07-23 DIAGNOSIS — M25512 Pain in left shoulder: Secondary | ICD-10-CM

## 2023-07-23 DIAGNOSIS — M9902 Segmental and somatic dysfunction of thoracic region: Secondary | ICD-10-CM

## 2023-07-23 DIAGNOSIS — M9901 Segmental and somatic dysfunction of cervical region: Secondary | ICD-10-CM

## 2023-07-23 NOTE — Patient Instructions (Signed)
 [place] or [address] Keep me updated You know where I work

## 2023-07-23 NOTE — Assessment & Plan Note (Signed)
 Neck exam shows the patient does have some tightness noted.  Attempted osteopathic manipulation today.  Discussed avoiding certain activities.  Increase activity as tolerated.  Follow-up with me again in 6 to 8 weeks otherwise.  Did discuss with patient though if worsening pain I do think we need to consider the possibility of an MRI of the cervical spine.  If patient would that she can write back and we will order.  Do feel that potential large breasts could be potentially playing a role as well with some of the posture and tightness noted.

## 2023-07-23 NOTE — Progress Notes (Signed)
 [facility] [address] [place] [address] Phone: [phone] Subjective:    I'm seeing this patient by the request  of:  Geofm Glade PARAS, MD  CC: neck and shoulder pain   YEP:Dlagzrupcz  Jenna Lane is a 33 y.o. female coming in with complaint of neck and left shoulder pain patient had MRI of the left shoulder that did not show anything specific.      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   IBS (irritable bowel syndrome)    MRSA infection 2009   on skin   Right ankle injury    Vitamin D  deficiency    Past Surgical History:  Procedure Laterality Date   CESAREAN SECTION N/A 11/15/2020   Procedure: PRIMARY CESAREAN SECTION EDC: 11-21-20 ALLERG: [name] ;  Surgeon: Marne Kelly Nest, MD;  Location: [facility];  Service: Obstetrics;  Laterality: N/A;   COLPOSCOPY  2011   skin cyst excision     left axillary   TONSILLECTOMY AND ADENOIDECTOMY N/A 01/02/2013   Procedure: TONSILLECTOMY ;  Surgeon: Marlyce Finer, MD;  Location: [facility] OR;  Service: ENT;  Laterality: N/A;   WISDOM TOOTH EXTRACTION     Social History   Socioeconomic History   Marital status: Married    Spouse name: Ellee Wawrzyniak   Number of children: 0   Years of education: Associates   Highest education level: Associate degree: occupational, Scientist, product/process development, or vocational program  Occupational History   Occupation: Art therapist- Engineer, structural  Tobacco Use   Smoking status: Never   Smokeless tobacco: Never  Vaping Use   Vaping status: Never Used  Substance and Sexual Activity   Alcohol use: Not Currently    Alcohol/week: 2.0 standard drinks of alcohol    Types: 2 Standard drinks or equivalent per week   Drug use: No    Sexual activity: Yes    Partners: Male    Birth control/protection: Inserts    Comment: nuvaring  Other Topics Concern   Not on file  Social History Narrative   Lives alone   Caffeine use: 1 cup daily   Right-handed   Social Drivers of Health   Financial Resource Strain: Low Risk  (03/25/2023)   Overall Financial Resource Strain (CARDIA)    Difficulty of Paying Living Expenses: Not hard at all  Food Insecurity: No Food Insecurity (03/25/2023)   Hunger Vital Sign    Worried About Running Out of Food in the Last Year: Never true    Ran Out of Food in the Last Year: Never true  Transportation Needs: No Transportation Needs (03/25/2023)   PRAPARE - Administrator, Civil Service (Medical): No    Lack of Transportation (Non-Medical): No  Physical Activity: Insufficiently Active (03/25/2023)   Exercise Vital Sign    Days of Exercise per Week: 3 days    Minutes of Exercise per Session: 30 min  Stress: No Stress Concern Present (03/25/2023)   [employer] of Occupational Health - Occupational Stress Questionnaire    Feeling of Stress : Only a little  Social Connections: Unknown (03/25/2023)   Social Connection and Isolation Panel  Frequency of Communication with Friends and Family: More than three times a week    Frequency of Social Gatherings with Friends and Family: Once a week    Attends Religious Services: Patient declined    Database administrator or Organizations: Yes    Attends Engineer, structural: More than 4 times per year    Marital Status: Married   Allergies  Allergen Reactions   [name]  [Drospirenone -Ethinyl Estradiol ] Rash   Family History  Problem Relation Age of Onset   Hyperlipidemia Father    Hypertension Father        ?   Depression Maternal Uncle    Lung cancer Maternal Grandfather    Heart disease Paternal Grandmother    Heart disease Paternal Grandfather     Current Outpatient Medications (Endocrine & Metabolic):    NUVARING  0.12-0.015 MG/24HR vaginal ring, Place 1 each vaginally every 30 (thirty) days.    Current Outpatient Medications (Analgesics):    meloxicam  (MOBIC ) 15 MG tablet, Take 1 tablet (15 mg total) by mouth daily.   Current Outpatient Medications (Other):    ALPRAZolam  (XANAX ) 0.5 MG tablet, Take 0.25-0.5 mg 30-60 minutes prior to dental appointment   Multiple Vitamin (MULTIVITAMIN) capsule, Take 1 capsule by mouth daily.   phentermine  37.5 MG capsule, Take 1 capsule (37.5 mg total) by mouth every morning.   sertraline  (ZOLOFT ) 50 MG tablet, Take 1 tablet (50 mg total) by mouth daily.   Reviewed prior external information including notes and imaging from  primary care provider As well as notes that were available from care everywhere and other healthcare systems.  Past medical history, social, surgical and family history all reviewed in electronic medical record.  No pertanent information unless stated regarding to the chief complaint.   Review of Systems:  No headache, visual changes, nausea, vomiting, diarrhea, constipation, dizziness, abdominal pain, skin rash, fevers, chills, night sweats, weight loss, swollen lymph nodes, body aches, joint swelling, chest pain, shortness of breath, mood changes. POSITIVE muscle aches  Objective  Height 5' 3 (1.6 m).   General: No apparent distress alert and oriented x3 mood and affect normal, dressed appropriately.  HEENT: Pupils equal, extraocular movements intact  Respiratory: Patient's speak in full sentences and does not appear short of breath  Cardiovascular: No lower extremity edema, non tender, no erythema  Neck does have some loss of lordosis.  Tightness noted in the paraspinal musculature.  Tightness with sidebending bilaterally right greater than left.  Osteopathic findings C2 flexed rotated and side bent right C4 flexed rotated and side bent left C6 flexed rotated and side bent left T5 extended rotated and side bent left inhaled third  rib L1 flexed rotated and side bent right Sacrum right on right     Impression and Recommendations:     Neck pain Neck exam shows the patient does have some tightness noted.  Attempted osteopathic manipulation today.  Discussed avoiding certain activities.  Increase activity as tolerated.  Follow-up with me again in 6 to 8 weeks otherwise.  Did discuss with patient though if worsening pain I do think we need to consider the possibility of an MRI of the cervical spine.  If patient would that she can write back and we will order.  Do feel that potential large breasts could be potentially playing a role as well with some of the posture and tightness noted.    Decision today to treat with OMT was based on Physical Exam  After verbal consent patient was  treated with HVLA, ME, FPR techniques in cervical, thoracic, rib, lumbar and sacral areas, all areas are chronic   Patient tolerated the procedure well with improvement in symptoms  Patient given exercises, stretches and lifestyle modifications  See medications in patient instructions if given  Patient will follow up in 4-8 weeks  The above documentation has been reviewed and is accurate and complete Abou Sterkel M Asia Favata, DO

## 2023-08-07 DIAGNOSIS — M25512 Pain in left shoulder: Secondary | ICD-10-CM | POA: Diagnosis not present

## 2023-09-01 ENCOUNTER — Encounter: Payer: Self-pay | Admitting: Internal Medicine

## 2023-09-01 NOTE — Patient Instructions (Addendum)
  Medications changes include :   Augmentin twice daily x 7 days     Return if symptoms worsen or fail to improve.

## 2023-09-01 NOTE — Progress Notes (Unsigned)
    Subjective:    Patient ID: Jenna Lane, female    DOB: 30-Jun-1990, 33 y.o.   MRN: [id number]      HPI Jenna Lane is here for No chief complaint on file.        Medications and allergies reviewed with patient and updated if appropriate.  Current Outpatient Medications on File Prior to Visit  Medication Sig Dispense Refill   ALPRAZolam  (XANAX ) 0.5 MG tablet Take 0.25-0.5 mg 30-60 minutes prior to dental appointment 10 tablet 0   meloxicam  (MOBIC ) 15 MG tablet Take 1 tablet (15 mg total) by mouth daily. 30 tablet 0   Multiple Vitamin (MULTIVITAMIN) capsule Take 1 capsule by mouth daily.     NUVARING 0.12-0.015 MG/24HR vaginal ring Place 1 each vaginally every 30 (thirty) days.     phentermine  37.5 MG capsule Take 1 capsule (37.5 mg total) by mouth every morning. 30 capsule 0   sertraline  (ZOLOFT ) 50 MG tablet Take 1 tablet (50 mg total) by mouth daily. 90 tablet 3   No current facility-administered medications on file prior to visit.    Review of Systems     Objective:  There were no vitals filed for this visit. BP Readings from Last 3 Encounters:  03/26/23 106/80  09/13/22 110/84  07/08/22 94/78   Wt Readings from Last 3 Encounters:  03/26/23 184 lb (83.5 kg)  09/13/22 178 lb (80.7 kg)  07/08/22 186 lb (84.4 kg)   There is no height or weight on file to calculate BMI.    Physical Exam Constitutional:      General: She is not in acute distress.    Appearance: Normal appearance. She is not ill-appearing.  HENT:     Head: Normocephalic and atraumatic.     Right Ear: Tympanic membrane, ear canal and external ear normal.     Left Ear: Tympanic membrane, ear canal and external ear normal.     Mouth/Throat:     Mouth: Mucous membranes are moist.     Pharynx: No oropharyngeal exudate or posterior oropharyngeal erythema.  Eyes:     Conjunctiva/sclera: Conjunctivae normal.  Cardiovascular:     Rate and Rhythm: Normal rate and regular rhythm.  Pulmonary:      Effort: Pulmonary effort is normal. No respiratory distress.     Breath sounds: Normal breath sounds. No wheezing or rales.  Musculoskeletal:     Cervical back: Neck supple. No tenderness.  Lymphadenopathy:     Cervical: No cervical adenopathy.  Skin:    General: Skin is warm and dry.  Neurological:     Mental Status: She is alert.            Assessment & Plan:    See Problem List for Assessment and Plan of chronic medical problems.

## 2023-09-02 ENCOUNTER — Ambulatory Visit (INDEPENDENT_AMBULATORY_CARE_PROVIDER_SITE_OTHER): Admitting: Internal Medicine

## 2023-09-02 VITALS — BP 106/60 | HR 87 | Temp 98.2°F | Ht 63.0 in | Wt 188.0 lb

## 2023-09-02 DIAGNOSIS — J069 Acute upper respiratory infection, unspecified: Secondary | ICD-10-CM | POA: Insufficient documentation

## 2023-09-02 MED ORDER — AMOXICILLIN-POT CLAVULANATE 875-125 MG PO TABS: 1.0000 | ORAL_TABLET | Freq: Two times a day (BID) | ORAL | 0 refills | Status: AC

## 2023-09-02 NOTE — Assessment & Plan Note (Signed)
 Acute Concern for bacterial cause given her history Start Augmentin  875-125 mg BID x 7 day Continue Tessalon  Perles otc cold medications Rest, fluid Call if no improvement

## 2023-11-06 ENCOUNTER — Encounter: Payer: Self-pay | Admitting: Internal Medicine

## 2023-11-10 NOTE — Progress Notes (Unsigned)
    Subjective:    Patient ID: Jenna Lane, female    DOB: 10-28-1990, 33 y.o.   MRN: [id number]      HPI Evangelyn is here for No chief complaint on file.        Medications and allergies reviewed with patient and updated if appropriate.  Current Outpatient Medications on File Prior to Visit  Medication Sig Dispense Refill   ALPRAZolam  (XANAX ) 0.5 MG tablet Take 0.25-0.5 mg 30-60 minutes prior to dental appointment 10 tablet 0   meloxicam  (MOBIC ) 15 MG tablet Take 1 tablet (15 mg total) by mouth daily. 30 tablet 0   Multiple Vitamin (MULTIVITAMIN) capsule Take 1 capsule by mouth daily.     NUVARING 0.12-0.015 MG/24HR vaginal ring Place 1 each vaginally every 30 (thirty) days.     phentermine  37.5 MG capsule Take 1 capsule (37.5 mg total) by mouth every morning. 30 capsule 0   sertraline  (ZOLOFT ) 50 MG tablet Take 1 tablet (50 mg total) by mouth daily. 90 tablet 3   No current facility-administered medications on file prior to visit.    Review of Systems     Objective:  There were no vitals filed for this visit. BP Readings from Last 3 Encounters:  09/02/23 106/60  03/26/23 106/80  09/13/22 110/84   Wt Readings from Last 3 Encounters:  09/02/23 188 lb (85.3 kg)  03/26/23 184 lb (83.5 kg)  09/13/22 178 lb (80.7 kg)   There is no height or weight on file to calculate BMI.    Physical Exam         Assessment & Plan:    See Problem List for Assessment and Plan of chronic medical problems.

## 2023-11-11 ENCOUNTER — Ambulatory Visit: Admitting: Internal Medicine

## 2023-11-11 VITALS — BP 98/68 | HR 71 | Temp 98.6°F | Ht 63.0 in | Wt 194.0 lb

## 2023-11-11 DIAGNOSIS — Z6834 Body mass index (BMI) 34.0-34.9, adult: Secondary | ICD-10-CM | POA: Diagnosis not present

## 2023-11-11 DIAGNOSIS — E66811 Obesity, class 1: Secondary | ICD-10-CM | POA: Diagnosis not present

## 2023-11-11 MED ORDER — PHENTERMINE HCL 37.5 MG PO TABS: 37.5000 mg | ORAL_TABLET | Freq: Every day | ORAL | 1 refills | Status: AC

## 2023-11-11 NOTE — Patient Instructions (Addendum)
        Medications changes include :   phentermine  37.5 mg - 1/2 a pill

## 2023-11-11 NOTE — Assessment & Plan Note (Signed)
 Chronic BMI 34.37 Struggling to lose weight Discussed options Stressed that the only thing that really can work long-term is regular exercise, decrease calorie intake diet high in protein, vegetables, fiber Discussed medication options Did not tolerate phentermine  37.5 mg daily in the past because of palpitations-she will try half of a pill daily to see if she tolerates that better-this was effective at higher dose and hopefully it will be effective for If it is not effective can consider adding small dose of Topamax 

## 2024-04-13 ENCOUNTER — Encounter: Admitting: Internal Medicine
